# Patient Record
Sex: Male | Born: 1945
Health system: Southern US, Community
[De-identification: ages and names within clinical notes are randomized; demographics above are authoritative.]

## PROBLEM LIST (undated history)

## (undated) DIAGNOSIS — L80 Vitiligo: Secondary | ICD-10-CM

## (undated) DIAGNOSIS — M1990 Unspecified osteoarthritis, unspecified site: Secondary | ICD-10-CM

## (undated) DIAGNOSIS — K219 Gastro-esophageal reflux disease without esophagitis: Secondary | ICD-10-CM

## (undated) DIAGNOSIS — M069 Rheumatoid arthritis, unspecified: Secondary | ICD-10-CM

## (undated) DIAGNOSIS — D126 Benign neoplasm of colon, unspecified: Secondary | ICD-10-CM

## (undated) DIAGNOSIS — I10 Essential (primary) hypertension: Secondary | ICD-10-CM

## (undated) DIAGNOSIS — B1920 Unspecified viral hepatitis C without hepatic coma: Secondary | ICD-10-CM

## (undated) HISTORY — PX: OTHER SURGICAL HISTORY: SHX169

## (undated) HISTORY — DX: Essential (primary) hypertension: I10

## (undated) HISTORY — DX: Unspecified osteoarthritis, unspecified site: M19.90

## (undated) HISTORY — DX: Gastro-esophageal reflux disease without esophagitis: K21.9

## (undated) HISTORY — DX: Rheumatoid arthritis, unspecified: M06.9

## (undated) HISTORY — DX: Benign neoplasm of colon, unspecified: D12.6

## (undated) HISTORY — DX: Vitiligo: L80

## (undated) HISTORY — DX: Unspecified viral hepatitis C without hepatic coma: B19.20

## (undated) HISTORY — PX: POLYPECTOMY: SHX149

---

## 2003-01-12 ENCOUNTER — Ambulatory Visit (HOSPITAL_COMMUNITY): Admission: RE | Admit: 2003-01-12 | Discharge: 2003-01-12 | Payer: Self-pay | Admitting: Family Medicine

## 2003-01-12 ENCOUNTER — Encounter: Payer: Self-pay | Admitting: Family Medicine

## 2005-02-03 ENCOUNTER — Ambulatory Visit: Payer: Self-pay | Admitting: Gastroenterology

## 2005-02-27 ENCOUNTER — Ambulatory Visit (HOSPITAL_COMMUNITY): Admission: RE | Admit: 2005-02-27 | Discharge: 2005-02-27 | Payer: Self-pay | Admitting: Obstetrics and Gynecology

## 2005-02-27 ENCOUNTER — Encounter (INDEPENDENT_AMBULATORY_CARE_PROVIDER_SITE_OTHER): Payer: Self-pay | Admitting: Interventional Radiology

## 2005-07-03 ENCOUNTER — Ambulatory Visit: Payer: Self-pay | Admitting: Gastroenterology

## 2005-08-12 ENCOUNTER — Ambulatory Visit: Payer: Self-pay | Admitting: Cardiology

## 2006-06-18 ENCOUNTER — Ambulatory Visit: Payer: Self-pay | Admitting: Internal Medicine

## 2006-07-02 ENCOUNTER — Ambulatory Visit: Payer: Self-pay | Admitting: Internal Medicine

## 2006-07-02 ENCOUNTER — Encounter (INDEPENDENT_AMBULATORY_CARE_PROVIDER_SITE_OTHER): Payer: Self-pay | Admitting: Specialist

## 2006-09-30 ENCOUNTER — Ambulatory Visit: Payer: Self-pay | Admitting: Gastroenterology

## 2006-10-07 ENCOUNTER — Ambulatory Visit: Payer: Self-pay | Admitting: Gastroenterology

## 2007-01-12 ENCOUNTER — Ambulatory Visit: Payer: Self-pay | Admitting: Gastroenterology

## 2007-03-10 ENCOUNTER — Ambulatory Visit: Payer: Self-pay | Admitting: Gastroenterology

## 2007-03-24 ENCOUNTER — Ambulatory Visit: Payer: Self-pay | Admitting: Gastroenterology

## 2007-04-14 ENCOUNTER — Ambulatory Visit: Payer: Self-pay | Admitting: Gastroenterology

## 2007-04-28 ENCOUNTER — Ambulatory Visit: Payer: Self-pay | Admitting: Gastroenterology

## 2007-05-19 ENCOUNTER — Ambulatory Visit: Payer: Self-pay | Admitting: Gastroenterology

## 2007-06-02 ENCOUNTER — Ambulatory Visit: Payer: Self-pay | Admitting: Gastroenterology

## 2007-06-16 ENCOUNTER — Ambulatory Visit: Payer: Self-pay | Admitting: Gastroenterology

## 2010-05-31 ENCOUNTER — Encounter: Payer: Self-pay | Admitting: Gastroenterology

## 2011-03-09 ENCOUNTER — Emergency Department (HOSPITAL_COMMUNITY): Payer: Medicare Other

## 2011-03-09 ENCOUNTER — Emergency Department (HOSPITAL_COMMUNITY)
Admission: EM | Admit: 2011-03-09 | Discharge: 2011-03-09 | Disposition: A | Discharge status: Home / Self Care | Payer: Medicare Other | Attending: Emergency Medicine | Admitting: Emergency Medicine

## 2011-03-09 DIAGNOSIS — Y9367 Activity, basketball: Secondary | ICD-10-CM | POA: Insufficient documentation

## 2011-03-09 DIAGNOSIS — W1801XA Striking against sports equipment with subsequent fall, initial encounter: Secondary | ICD-10-CM | POA: Insufficient documentation

## 2011-03-09 DIAGNOSIS — M7989 Other specified soft tissue disorders: Secondary | ICD-10-CM | POA: Insufficient documentation

## 2011-03-09 DIAGNOSIS — Y929 Unspecified place or not applicable: Secondary | ICD-10-CM | POA: Insufficient documentation

## 2011-03-09 DIAGNOSIS — I10 Essential (primary) hypertension: Secondary | ICD-10-CM | POA: Insufficient documentation

## 2011-03-09 DIAGNOSIS — T148XXA Other injury of unspecified body region, initial encounter: Secondary | ICD-10-CM | POA: Insufficient documentation

## 2011-03-09 DIAGNOSIS — M25579 Pain in unspecified ankle and joints of unspecified foot: Secondary | ICD-10-CM | POA: Insufficient documentation

## 2011-07-08 ENCOUNTER — Encounter: Payer: Self-pay | Admitting: Internal Medicine

## 2011-07-28 ENCOUNTER — Encounter: Payer: Self-pay | Admitting: Internal Medicine

## 2011-09-07 ENCOUNTER — Ambulatory Visit (AMBULATORY_SURGERY_CENTER): Payer: Medicare Other | Admitting: *Deleted

## 2011-09-07 VITALS — Ht 67.75 in | Wt 170.0 lb

## 2011-09-07 DIAGNOSIS — Z1211 Encounter for screening for malignant neoplasm of colon: Secondary | ICD-10-CM

## 2011-09-07 MED ORDER — PEG-KCL-NACL-NASULF-NA ASC-C 100 G PO SOLR: ORAL | Status: DC

## 2011-09-18 ENCOUNTER — Telehealth: Payer: Self-pay | Admitting: Internal Medicine

## 2011-09-18 NOTE — Telephone Encounter (Signed)
Left message for pt to call back  °

## 2011-09-23 ENCOUNTER — Encounter: Payer: Self-pay | Admitting: Internal Medicine

## 2011-09-23 ENCOUNTER — Ambulatory Visit (AMBULATORY_SURGERY_CENTER): Payer: Medicare Other | Admitting: Internal Medicine

## 2011-09-23 VITALS — BP 152/69 | HR 62 | Temp 97.2°F | Resp 14 | Ht 67.75 in | Wt 170.0 lb

## 2011-09-23 DIAGNOSIS — Z86010 Personal history of colonic polyps: Secondary | ICD-10-CM

## 2011-09-23 DIAGNOSIS — Z1211 Encounter for screening for malignant neoplasm of colon: Secondary | ICD-10-CM

## 2011-09-23 MED ORDER — SODIUM CHLORIDE 0.9 % IV SOLN: 500.0000 mL | INTRAVENOUS | Status: DC

## 2011-09-23 NOTE — Op Note (Signed)
[facility] [address] [place], [place]  [address]  COLONOSCOPY PROCEDURE REPORT  PATIENT:  Jimmy, Castaneda  MR#:  [id number] BIRTHDATE:  Dec 08, 1945, 65 yrs. old  GENDER:  male ENDOSCOPIST:  Hedwig Morton. Juanda Chance, MD REF. BY: PROCEDURE DATE:  09/23/2011 PROCEDURE:  Colonoscopy 19147 ASA CLASS:  Class II INDICATIONS:  history of pre-cancerous (adenomatous) colon polyps tubular adenoma removed in 2008 MEDICATIONS:   MAC sedation, administered by CRNA, propofol (Diprivan) 230 mg  DESCRIPTION OF PROCEDURE:   After the risks and benefits and of the procedure were explained, informed consent was obtained. Digital rectal exam was performed and revealed no rectal masses. The LB CF-[id number] endoscope was introduced through the anus and advanced to the cecum, which was identified by both the appendix and ileocecal valve.  The quality of the prep was excellent, using MoviPrep.  The instrument was then slowly withdrawn as the colon was fully examined. <<PROCEDUREIMAGES>>  FINDINGS:  Internal Hemorrhoids were found (see image6 and image5).  Scattered diverticula were found in the sigmoid colon (see image4 and image1).  This was otherwise a normal examination of the colon (see image2 and image3).   Retroflexed views in the rectum revealed no abnormalities.    The scope was then withdrawn from the patient and the procedure completed.  COMPLICATIONS:  None ENDOSCOPIC IMPRESSION: 1) Internal hemorrhoids 2) Diverticula, scattered in the sigmoid colon 3) Otherwise normal examination RECOMMENDATIONS: 1) High fiber diet.  REPEAT EXAM:  In 10 year(s) for.  ______________________________ Hedwig Morton. Juanda Chance, MD  CC:  n. eSIGNED:   Hedwig Morton. Hien Cunliffe at 09/23/2011 11:39 AM  Julianne Rice, [phone]

## 2011-09-23 NOTE — Telephone Encounter (Signed)
Unable to determine who called patient.

## 2011-09-23 NOTE — Patient Instructions (Signed)
INTERNAL HEMORRHOIDS AND DIVERTICULOSIS SEEN. TRY TO FOLLOW HIGH FIBER DIET,SEE HANDOUTS. REPEAT COLONOSCOPY IN 10 YEARS. FOLLOW DISCHARGE INSTRUCTIONS GIVEN TODAY. THANK YOU!!   YOU HAD AN ENDOSCOPIC PROCEDURE TODAY AT THE [facility]: Refer to the procedure report that was given to you for any specific questions about what was found during the examination.  If the procedure report does not answer your questions, please call your gastroenterologist to clarify.  If you requested that your care partner not be given the details of your procedure findings, then the procedure report has been included in a sealed envelope for you to review at your convenience later.  YOU SHOULD EXPECT: Some feelings of bloating in the abdomen. Passage of more gas than usual.  Walking can help get rid of the air that was put into your GI tract during the procedure and reduce the bloating. If you had a lower endoscopy (such as a colonoscopy or flexible sigmoidoscopy) you may notice spotting of blood in your stool or on the toilet paper. If you underwent a bowel prep for your procedure, then you may not have a normal bowel movement for a few days.  DIET: Your first meal following the procedure should be a light meal and then it is ok to progress to your normal diet.  A half-sandwich or bowl of soup is an example of a good first meal.  Heavy or fried foods are harder to digest and may make you feel nauseous or bloated.  Likewise meals heavy in dairy and vegetables can cause extra gas to form and this can also increase the bloating.  Drink plenty of fluids but you should avoid alcoholic beverages for 24 hours.  ACTIVITY: Your care partner should take you home directly after the procedure.  You should plan to take it easy, moving slowly for the rest of the day.  You can resume normal activity the day after the procedure however you should NOT DRIVE or use heavy machinery for 24 hours (because of the sedation medicines  used during the test).    SYMPTOMS TO REPORT IMMEDIATELY: A gastroenterologist can be reached at any hour.  During normal business hours, 8:30 AM to 5:00 PM Monday through Friday, call [phone].  After hours and on weekends, please call the GI answering service at [phone] who will take a message and have the physician on call contact you.   Following lower endoscopy (colonoscopy or flexible sigmoidoscopy):  Excessive amounts of blood in the stool  Significant tenderness or worsening of abdominal pains  Swelling of the abdomen that is new, acute  Fever of 100F or higher  FOLLOW UP:  Our staff will call the home number listed on your records the next business day following your procedure to check on you and address any questions or concerns that you may have at that time regarding the information given to you following your procedure. This is a courtesy call and so if there is no answer at the home number and we have not heard from you through the emergency physician on call, we will assume that you have returned to your regular daily activities without incident.  SIGNATURES/CONFIDENTIALITY: You and/or your care partner have signed paperwork which will be entered into your electronic medical record.  These signatures attest to the fact that that the information above on your After Visit Summary has been reviewed and is understood.  Full responsibility of the confidentiality of this discharge information lies with you and/or your care-partner.

## 2011-09-23 NOTE — Progress Notes (Signed)
Patient did not experience any of the following events: a burn prior to discharge; a fall within the facility; wrong site/side/patient/procedure/implant event; or a hospital transfer or hospital admission upon discharge from the facility. (G8907) Patient did not have preoperative order for IV antibiotic SSI prophylaxis. (G8918)  

## 2011-09-24 ENCOUNTER — Telehealth: Payer: Self-pay | Admitting: *Deleted

## 2012-05-11 ENCOUNTER — Encounter (HOSPITAL_COMMUNITY): Payer: Self-pay | Admitting: Emergency Medicine

## 2012-05-11 ENCOUNTER — Emergency Department (INDEPENDENT_AMBULATORY_CARE_PROVIDER_SITE_OTHER)
Admission: EM | Admit: 2012-05-11 | Discharge: 2012-05-11 | Disposition: A | Discharge status: Home / Self Care | Payer: Medicare Other | Source: Home / Self Care

## 2012-05-11 DIAGNOSIS — I10 Essential (primary) hypertension: Secondary | ICD-10-CM

## 2012-05-11 HISTORY — PX: COLONOSCOPY: SHX174

## 2012-05-11 MED ORDER — AMLODIPINE BESYLATE 5 MG PO TABS: ORAL_TABLET | ORAL | Status: DC

## 2012-05-11 NOTE — ED Provider Notes (Signed)
History     CSN: [id number]  Arrival date & time 05/11/12  1028   None     Chief Complaint  Patient presents with  . Medication Refill    (Consider location/radiation/quality/duration/timing/severity/associated sxs/prior treatment) HPI Comments: 67 year old male with a history of hypertension and is requesting a refill Norvasc 5 mg. In addition he is treated with metoprolol 100 mg tablets one half tablet twice a day and HCTZ 25 mg daily. He is recently established with a PCP but his initial appointment is in March. He denies chest pain, shortness of breath, edema or exertional symptoms. He has no complaints today.   Past Medical History  Diagnosis Date  . Hypertension   . Adenomatous colon polyp     Past Surgical History  Procedure Date  . Polypectomy   . Colonoscopy     No family history on file.  History  Substance Use Topics  . Smoking status: Former Games developer  . Smokeless tobacco: Never Used  . Alcohol Use: 3.6 oz/week    6 Cans of beer per week      Review of Systems  All other systems reviewed and are negative.    Allergies  Aspirin  Home Medications   Current Outpatient Rx  Name  Route  Sig  Dispense  Refill  . AMLODIPINE BESYLATE 5 MG PO TABS   Oral   Take 5 mg by mouth daily.         Marland Kitchen HYDROCHLOROTHIAZIDE 25 MG PO TABS   Oral   Take 25 mg by mouth daily.         Marland Kitchen METOPROLOL TARTRATE 100 MG PO TABS   Oral   Take 100 mg by mouth 2 (two) times daily. 1/2 tablet twice a day         . AMLODIPINE BESYLATE 5 MG PO TABS      1 tablet po q d for BP   30 tablet   0   . NAPROXEN 500 MG PO TABS   Oral   Take 500 mg by mouth 2 (two) times daily with a meal.         . TACROLIMUS 0.1 % EX OINT   Topical   Apply 1 application topically 2 (two) times daily.         . TRIAMCINOLONE ACETONIDE 0.1 % EX CREA   Topical   Apply 1 application topically 2 (two) times daily.           BP 135/76  Pulse 52  Temp 98.1 F (36.7 C) (Oral)   Resp 20  SpO2 100%  Physical Exam  Constitutional: He is oriented to person, place, and time. He appears well-developed and well-nourished. No distress.  HENT:  Head: Normocephalic and atraumatic.  Eyes: Conjunctivae normal and EOM are normal.  Neck: Normal range of motion. Neck supple.  Cardiovascular: Normal rate, regular rhythm and normal heart sounds.   Pulmonary/Chest: Effort normal and breath sounds normal. No respiratory distress. He has no wheezes.  Musculoskeletal: Normal range of motion. He exhibits no edema and no tenderness.  Neurological: He is alert and oriented to person, place, and time. He exhibits normal muscle tone.  Skin: Skin is warm and dry. No rash noted.  Psychiatric: He has a normal mood and affect.    ED Course  Procedures (including critical care time)  Labs Reviewed - No data to display No results found.   1. HTN (hypertension)       MDM  Amlodipine 5 mg  one daily for blood pressure. #30.  He is to f/u his physician as soon as possible. He should also take his blood pressures out of the office such as a [employer] or other drugstores and write them down to bring to his physician. Mariea Clonts new symptoms problems or worsening to may return Is discharged in stable condition.        Hayden Rasmussen, NP 05/11/12 1114

## 2012-05-11 NOTE — ED Notes (Signed)
Pt is here to have is Norvasc refilled... Has an appt w/PCP to establish in March and will run out of his med by then... Denies: any med problems... He is alert w/no signs of acute distress.

## 2012-05-12 NOTE — ED Provider Notes (Signed)
Medical screening examination/treatment/procedure(s) were performed by resident physician or non-physician practitioner and as supervising physician I was immediately available for consultation/collaboration.   Mysti Haley DOUGLAS MD.    Jacquese Cassarino D Balian Schaller, MD 05/12/12 2037 

## 2013-06-02 ENCOUNTER — Other Ambulatory Visit (HOSPITAL_COMMUNITY): Payer: Self-pay | Admitting: Dentistry

## 2013-06-02 ENCOUNTER — Encounter (INDEPENDENT_AMBULATORY_CARE_PROVIDER_SITE_OTHER): Payer: Self-pay

## 2013-06-02 ENCOUNTER — Encounter (HOSPITAL_COMMUNITY): Payer: Self-pay | Admitting: Dentistry

## 2013-06-02 ENCOUNTER — Ambulatory Visit (HOSPITAL_COMMUNITY): Payer: Self-pay | Admitting: Dentistry

## 2013-06-02 VITALS — BP 135/80 | HR 52 | Temp 97.8°F

## 2013-06-02 DIAGNOSIS — M1610 Unilateral primary osteoarthritis, unspecified hip: Secondary | ICD-10-CM

## 2013-06-02 DIAGNOSIS — M169 Osteoarthritis of hip, unspecified: Secondary | ICD-10-CM | POA: Insufficient documentation

## 2013-06-02 DIAGNOSIS — K045 Chronic apical periodontitis: Secondary | ICD-10-CM | POA: Insufficient documentation

## 2013-06-02 DIAGNOSIS — K0889 Other specified disorders of teeth and supporting structures: Secondary | ICD-10-CM

## 2013-06-02 DIAGNOSIS — K083 Retained dental root: Secondary | ICD-10-CM | POA: Insufficient documentation

## 2013-06-02 DIAGNOSIS — K0530 Chronic periodontitis, unspecified: Secondary | ICD-10-CM | POA: Insufficient documentation

## 2013-06-02 DIAGNOSIS — IMO0002 Reserved for concepts with insufficient information to code with codable children: Secondary | ICD-10-CM

## 2013-06-02 DIAGNOSIS — Z972 Presence of dental prosthetic device (complete) (partial): Secondary | ICD-10-CM

## 2013-06-02 DIAGNOSIS — K08409 Partial loss of teeth, unspecified cause, unspecified class: Secondary | ICD-10-CM

## 2013-06-02 DIAGNOSIS — M264 Malocclusion, unspecified: Secondary | ICD-10-CM

## 2013-06-02 DIAGNOSIS — K029 Dental caries, unspecified: Secondary | ICD-10-CM

## 2013-06-02 DIAGNOSIS — Z0189 Encounter for other specified special examinations: Secondary | ICD-10-CM

## 2013-06-02 DIAGNOSIS — K036 Deposits [accretions] on teeth: Secondary | ICD-10-CM

## 2013-06-02 NOTE — Progress Notes (Signed)
DENTAL CONSULTATION  Date of Consultation:  06/02/2013 Patient Name:   Jimmy Castaneda Date of Birth:   April 14, 1946 Medical Record Number: [id number]  VITALS: BP 135/80  Pulse 52  Temp(Src) 97.8 F (36.6 C) (Oral)   CHIEF COMPLAINT: Patient is referred by Dr. Adriana Mccallum for a pre-total hip replacement dental protocol examination.  HPI: Jimmy Castaneda is a 68 year old male with osteoarthritis of the left hip. Patient with anticipated total hip replacement with Dr. Alvan Dame. Patient was referred referred by Dr. Adriana Mccallum for a medically necessary pre-total hip replacement dental protocol examination to rule out dental infection that may affect the patient's systemic health and anticipated total hip replacement.  Patient currently denies acute toothache, swellings, or abscesses. Patient was last seen approximately 10 years ago to have an upper flexible partial denture fabricated. Patient is not having any problems with this upper partial denture. Patient does not seek regular dental care. Patient is interested in having all remaining teeth extracted at this time with subsequent fabrication of upper and lower complete dentures after adequate healing.  PROBLEM LIST: Patient Active Problem List   Diagnosis Date Noted  . Osteoarthritis of hip 06/02/2013  . 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 Retained dental roots 06/02/2013  . Chronic periodontitis 06/02/2013    PMH: Past Medical History  Diagnosis Date  . Hypertension   . Adenomatous colon polyp   . GERD (gastroesophageal reflux disease)   . Hepatitis C   . Osteoarthritis   . Rheumatoid arthritis   . Vitiligo     PSH: Past Surgical History  Procedure Laterality Date  . Polypectomy    . Colonoscopy      ALLERGIES: Allergies  Allergen Reactions  . Aspirin Hives    MEDICATIONS: Current Outpatient Prescriptions  Medication Sig Dispense Refill  . amLODipine (NORVASC) 5 MG tablet 1 tablet po q d for BP  30 tablet   0  . hydrochlorothiazide (HYDRODIURIL) 25 MG tablet Take 25 mg by mouth daily.      . metoprolol (LOPRESSOR) 100 MG tablet Take 100 mg by mouth 2 (two) times daily. 1/2 tablet twice a day      . oxyCODONE-acetaminophen (PERCOCET/ROXICET) 5-325 MG per tablet Take 1-2 tablets by mouth daily.      . tacrolimus (PROTOPIC) 0.1 % ointment Apply 1 application topically 2 (two) times daily.      . naproxen (NAPROSYN) 500 MG tablet Take 500 mg by mouth 2 (two) times daily with a meal.      . triamcinolone cream (KENALOG) 0.1 % Apply 1 application topically 2 (two) times daily.       No current facility-administered medications for this visit.    LABS: No results found for this basename: WBC, HGB, HCT, MCV, PLT   No results found for this basename: na, k, cl, co2, glucose, bun, creatinine, calcium, gfrnonaa, gfraa   No results found for this basename: INR, PROTIME   No results found for this basename: PTT    SOCIAL HISTORY: History   Social History  . Marital Status: Married    Spouse Name: N/A    Number of Children: 4  . Years of Education: N/A   Occupational History  . Not on file.   Social History Main Topics  . Smoking status: Former Smoker -- 0.50 packs/day for 35 years    Quit date: 06/03/1995  . Smokeless tobacco: Never Used  . Alcohol Use: 3.6 oz/week    6 Cans of beer per week  .  Drug Use: 1.00 per week     Comment: marijuana  . Sexual Activity: Not on file   Other Topics Concern  . Not on file   Social History Narrative  . No narrative on file    FAMILY HISTORY: Family History  Problem Relation Age of Onset  . Diabetes Mother   . Diabetes Father   . Hypertension Mother   . Hypertension Father   . Diabetes Sister      REVIEW OF SYSTEMS: Reviewed from chart for this admission.  DENTAL HISTORY: CHIEF COMPLAINT: Patient is referred by Dr. Adriana Mccallum for a pre-total hip replacement dental protocol examination.  HPI: Jimmy Castaneda is a 68 year old  male with osteoarthritis of the left hip. Patient with anticipated total hip replacement with Dr. Alvan Dame. Patient was referred referred by Dr. Adriana Mccallum for a medically necessary pre-total hip replacement dental protocol examination to rule out dental infection that may affect the patient's systemic health and anticipated total hip replacement.  Patient currently denies acute toothache, swellings, or abscesses. Patient was last seen approximately 10 years ago to have an upper flexible partial denture fabricated. Patient is not having any problems with this upper partial denture. Patient does not seek regular dental care. Patient is interested in having all remaining teeth extracted at this time with subsequent fabrication of upper and lower complete dentures after adequate healing.   DENTAL EXAMINATION:  GENERAL: The patient is a well-developed, well-nourished male in no acute distress. HEAD AND NECK: There is no palpable submandibular lymphadenopathy. The patient denies acute TMJ symptoms  INTRAORAL EXAM: The patient has normal saliva. I do not see any evidence of abscess formation in the mouth. The patient has atrophy of the edentulous alveolar ridges.  DENTITION: The patient is missing tooth numbers 1, 2, 6, 7, 11, 13, 14, 15, 16, 17, 18, 19, 20, 24, 25, 26, 28, 29, 30, 31, and 32. Tooth numbers 10 and 12 are present as retained root segments.  PERIODONTAL: The patient has chronic periodontitis with plaque and calculus accumulations, generalized gingival recession, and generalized tooth mobility. There is moderate to severe bone loss noted.  DENTAL CARIES/SUBOPTIMAL RESTORATIONS: There are multiple dental caries noted as per dental charting form. ENDODONTIC: Patient currently denies acute pulpitis symptoms. The patient has multiple areas of periapical pathology noted at the apices of tooth numbers 3, 8, 10, 12. CROWN AND BRIDGE: There are no crown or bridge restorations noted.  PROSTHODONTIC: The  patient has an upper acrylic flexible partial denture. This partial has less than ideal retention and stability  OCCLUSION: The patient has a poor occlusal scheme secondary to multiple missing teeth, multiple retained root segments, supra-eruption and drifting of the unopposed teeth into the edentulous areas, and lack of replacement of the missing teeth with clinically acceptable dental prostheses.   RADIOGRAPHIC INTERPRETATION:  An orthopantogram was taken and supplemented with 8 periapical radiographs. There are multiple missing teeth. There are multiple retained root segments. There are multiple dental caries noted. There multiple areas of periapical pathology and radiolucency. There is supra-eruption and drifting of the unopposed teeth into the edentulous areas. There is moderate to severe bone loss noted.    ASSESSMENTS: 1.  Osteoarthritis of the left hip with anticipated total hip replacement 2. Chronic apical periodontitis 3. Chronic periodontitis with bone loss 4. Gingival recession 5. Tooth mobility 6. Plaque and calculus accumulations 7. Multiple dental caries 8. Multiple missing teeth 9. Multiple retained root segments 10. Supra-eruption and drifting of the unopposed teeth  into the edentulous areas 11. Atrophy of the edentulous alveolar ridge 12. Ill fitting maxillary acrylic partial denture 13. Poor occlusal scheme 14. History of hepatitis C with questionable risk for bleeding with invasive dental procedures  PLAN/RECOMMENDATIONS: 1. I discussed the risks, benefits, and complications of various treatment options with the patient in relationship to his medical and dental conditions, anticipated total hip replacement, and the potential risk for infection from teeth without dental treatment. We discussed various treatment options to include no treatment, multiple extractions with alveoloplasty, pre-prosthetic surgery as indicated, periodontal therapy, dental restorations, root canal  therapy, crown and bridge therapy, implant therapy, and replacement of missing teeth as indicated. The patient currently wishes to proceed with  extraction of remaining teeth with alveoloplasty and pre-prosthetic surgery as indicated in the operating room with general anesthesia.  This is been scheduled for 06/22/2013 at 7:30 AM at [facility]. Presurgical testing appointment has been scheduled for 06/19/2013 at 8 AM at [facility].  Patient will then followup with a dentist of his choice for fabrication of upper lower complete dentures after adequate healing. Patient will followup with Dr. Adriana Mccallum for total hip replacement after adequate healing from the anticipated dental extractions.  The patient has expressed interest in having his teeth extracted and dentures fabricated before proceeding with his total hip replacement.   2. Discussion of findings with medical team and coordination of future medical and dental care as needed.  I spent 60 minutes face to face with patient and more than 50% of time was spent in counseling and /or coordination of care.   Lenn Cal, DDS

## 2013-06-02 NOTE — Patient Instructions (Signed)
Patient to be scheduled for multiple extractions with alveoloplasty and pre-prosthetic surgery as indicated in the operating room with general anesthesia. Patient then to proceed with total hip replacement after adequate healing.. Patient is to follow up with the general dentist of his choice for fabrication of upper and lower complete dentures after adequate healing and once medically stable from the anticipated hip replacement.  Dr. Enrique Sack

## 2013-06-19 ENCOUNTER — Encounter (HOSPITAL_COMMUNITY): Payer: Self-pay | Admitting: Pharmacy Technician

## 2013-06-19 ENCOUNTER — Encounter (HOSPITAL_COMMUNITY): Payer: Self-pay

## 2013-06-19 ENCOUNTER — Ambulatory Visit (HOSPITAL_COMMUNITY)
Admission: RE | Admit: 2013-06-19 | Discharge: 2013-06-19 | Disposition: A | Discharge status: Home / Self Care | Payer: Medicare Other | Source: Ambulatory Visit | Attending: Dentistry | Admitting: Dentistry

## 2013-06-19 ENCOUNTER — Encounter (HOSPITAL_COMMUNITY)
Admission: RE | Admit: 2013-06-19 | Discharge: 2013-06-19 | Disposition: A | Discharge status: Home / Self Care | Payer: Medicare Other | Source: Ambulatory Visit | Attending: Dentistry | Admitting: Dentistry

## 2013-06-19 DIAGNOSIS — Z01818 Encounter for other preprocedural examination: Secondary | ICD-10-CM | POA: Insufficient documentation

## 2013-06-19 DIAGNOSIS — I498 Other specified cardiac arrhythmias: Secondary | ICD-10-CM | POA: Insufficient documentation

## 2013-06-19 DIAGNOSIS — Z01810 Encounter for preprocedural cardiovascular examination: Secondary | ICD-10-CM | POA: Insufficient documentation

## 2013-06-19 DIAGNOSIS — Z01812 Encounter for preprocedural laboratory examination: Secondary | ICD-10-CM | POA: Insufficient documentation

## 2013-06-19 NOTE — Patient Instructions (Signed)
   YOUR SURGERY IS SCHEDULED AT [facility]  ON:   Thursday  2/12  REPORT TO  SHORT STAY CENTER AT:  5:30 AM      PHONE # FOR SHORT STAY IS [phone]  DO NOT EAT OR DRINK ANYTHING AFTER MIDNIGHT THE NIGHT BEFORE YOUR SURGERY.  YOU MAY BRUSH YOUR TEETH, RINSE OUT YOUR MOUTH--BUT NO WATER, NO FOOD, NO CHEWING GUM, NO MINTS, NO CANDIES, NO CHEWING TOBACCO.  PLEASE TAKE THE FOLLOWING MEDICATIONS THE AM OF YOUR SURGERY WITH A FEW SIPS OF WATER:  AMLODIPINE, METOPROLOL   DO NOT BRING VALUABLES, MONEY, CREDIT CARDS.  DO NOT WEAR JEWELRY, MAKE-UP, NAIL POLISH AND NO METAL PINS OR CLIPS IN YOUR HAIR. CONTACT LENS, DENTURES / PARTIALS, GLASSES SHOULD NOT BE WORN TO SURGERY AND IN MOST CASES-HEARING AIDS WILL NEED TO BE REMOVED.  BRING YOUR GLASSES CASE, ANY EQUIPMENT NEEDED FOR YOUR CONTACT LENS. FOR PATIENTS ADMITTED TO THE HOSPITAL--CHECK OUT TIME THE DAY OF DISCHARGE IS 11:00 AM.  ALL INPATIENT ROOMS ARE PRIVATE - WITH BATHROOM, TELEPHONE, TELEVISION AND WIFI INTERNET.  IF YOU ARE BEING DISCHARGED THE SAME DAY OF YOUR SURGERY--YOU CAN NOT DRIVE YOURSELF HOME--AND SHOULD NOT GO HOME ALONE BY TAXI OR BUS.  NO DRIVING OR OPERATING MACHINERY, DO NOT MAKE LEGAL DECISIONS FOR 24 HOURS FOLLOWING ANESTHESIA / PAIN MEDICATIONS.  PLEASE MAKE ARRANGEMENTS FOR SOMEONE TO BE WITH YOU AT HOME THE FIRST 24 HOURS AFTER SURGERY. RESPONSIBLE DRIVER'S NAME / PHONE   FRIEND - ERNIE GREEN  [address] INSTRUCTIONS MAY RESULT IN THE CANCELLATION OF YOUR SURGERY. PLEASE BE AWARE THAT YOU MAY NEED ADDITIONAL BLOOD DRAWN DAY OF YOUR SURGERY  PATIENT SIGNATURE_________________________________

## 2013-06-19 NOTE — Pre-Procedure Instructions (Signed)
MEDICAL CLEARANCE, CBC, CMET, PT, PTT RESULTS ON PT'S CHART FROM DR. HUSAIN-DONE 06/16/13.  EKG AND CXR WERE DONE TODAY - PREOP - AT [facility]

## 2013-06-21 NOTE — Anesthesia Preprocedure Evaluation (Addendum)
Anesthesia Evaluation  Patient identified by MRN, date of birth, ID band Patient awake    Reviewed: Allergy & Precautions, H&P , NPO status , Patient's Chart, lab work & pertinent test results  Airway Mallampati: II TM Distance: >3 FB Neck ROM: Full    Dental  (+) Poor Dentition, Chipped, Missing, Dental Advisory Given   Pulmonary neg pulmonary ROS, former smoker,  breath sounds clear to auscultation  Pulmonary exam normal       Cardiovascular hypertension, Pt. on medications and Pt. on home beta blockers negative cardio ROS  Rhythm:Regular Rate:Normal     Neuro/Psych negative neurological ROS  negative psych ROS   GI/Hepatic negative GI ROS, Neg liver ROS, GERD-  ,(+) Hepatitis -, C  Endo/Other  negative endocrine ROS  Renal/GU negative Renal ROS  negative genitourinary   Musculoskeletal negative musculoskeletal ROS (+) Arthritis -,   Abdominal   Peds  Hematology negative hematology ROS (+)   Anesthesia Other Findings   Reproductive/Obstetrics                          Anesthesia Physical Anesthesia Plan  ASA: III  Anesthesia Plan: General   Post-op Pain Management:    Induction: Intravenous  Airway Management Planned: Nasal ETT  Additional Equipment:   Intra-op Plan:   Post-operative Plan: Extubation in OR  Informed Consent: I have reviewed the patients History and Physical, chart, labs and discussed the procedure including the risks, benefits and alternatives for the proposed anesthesia with the patient or authorized representative who has indicated his/her understanding and acceptance.   Dental advisory given  Plan Discussed with: CRNA  Anesthesia Plan Comments:         Anesthesia Quick Evaluation

## 2013-06-22 ENCOUNTER — Encounter (HOSPITAL_COMMUNITY): Payer: Self-pay | Admitting: *Deleted

## 2013-06-22 ENCOUNTER — Encounter (HOSPITAL_COMMUNITY): Payer: Medicare Other | Admitting: Anesthesiology

## 2013-06-22 ENCOUNTER — Ambulatory Visit (HOSPITAL_COMMUNITY): Payer: Medicare Other | Admitting: Anesthesiology

## 2013-06-22 ENCOUNTER — Encounter (HOSPITAL_COMMUNITY)
Admission: RE | Disposition: A | Discharge status: Home / Self Care | Payer: Self-pay | Source: Ambulatory Visit | Attending: Dentistry

## 2013-06-22 ENCOUNTER — Ambulatory Visit (HOSPITAL_COMMUNITY)
Admission: RE | Admit: 2013-06-22 | Discharge: 2013-06-22 | Disposition: A | Discharge status: Home / Self Care | Payer: Medicare Other | Source: Ambulatory Visit | Attending: Dentistry | Admitting: Dentistry

## 2013-06-22 DIAGNOSIS — K083 Retained dental root: Secondary | ICD-10-CM | POA: Insufficient documentation

## 2013-06-22 DIAGNOSIS — Z87891 Personal history of nicotine dependence: Secondary | ICD-10-CM | POA: Insufficient documentation

## 2013-06-22 DIAGNOSIS — K029 Dental caries, unspecified: Secondary | ICD-10-CM | POA: Insufficient documentation

## 2013-06-22 DIAGNOSIS — K045 Chronic apical periodontitis: Secondary | ICD-10-CM

## 2013-06-22 DIAGNOSIS — Z79899 Other long term (current) drug therapy: Secondary | ICD-10-CM | POA: Insufficient documentation

## 2013-06-22 DIAGNOSIS — M1610 Unilateral primary osteoarthritis, unspecified hip: Secondary | ICD-10-CM | POA: Insufficient documentation

## 2013-06-22 DIAGNOSIS — K0530 Chronic periodontitis, unspecified: Secondary | ICD-10-CM

## 2013-06-22 DIAGNOSIS — M169 Osteoarthritis of hip, unspecified: Secondary | ICD-10-CM

## 2013-06-22 DIAGNOSIS — K219 Gastro-esophageal reflux disease without esophagitis: Secondary | ICD-10-CM | POA: Insufficient documentation

## 2013-06-22 DIAGNOSIS — M278 Other specified diseases of jaws: Secondary | ICD-10-CM | POA: Insufficient documentation

## 2013-06-22 DIAGNOSIS — I10 Essential (primary) hypertension: Secondary | ICD-10-CM | POA: Insufficient documentation

## 2013-06-22 DIAGNOSIS — L80 Vitiligo: Secondary | ICD-10-CM | POA: Insufficient documentation

## 2013-06-22 HISTORY — PX: MULTIPLE EXTRACTIONS WITH ALVEOLOPLASTY: SHX5342

## 2013-06-22 SURGERY — MULTIPLE EXTRACTION WITH ALVEOLOPLASTY
Anesthesia: General

## 2013-06-22 MED ORDER — DEXAMETHASONE SODIUM PHOSPHATE 10 MG/ML IJ SOLN
INTRAMUSCULAR | Status: DC | PRN
  Administered 2013-06-22: 10 mg via INTRAVENOUS

## 2013-06-22 MED ORDER — BUPIVACAINE-EPINEPHRINE PF 0.5-1:200000 % IJ SOLN
INTRAMUSCULAR | Status: DC | PRN
  Administered 2013-06-22: 3.6 mL

## 2013-06-22 MED ORDER — OXYCODONE-ACETAMINOPHEN 10-325 MG PO TABS: 1.0000 | ORAL_TABLET | ORAL | Status: DC | PRN

## 2013-06-22 MED ORDER — ROCURONIUM BROMIDE 100 MG/10ML IV SOLN
INTRAVENOUS | Status: DC | PRN
  Administered 2013-06-22: 50 mg via INTRAVENOUS
  Administered 2013-06-22: 10 mg via INTRAVENOUS

## 2013-06-22 MED ORDER — PROMETHAZINE HCL 25 MG/ML IJ SOLN: 6.2500 mg | INTRAMUSCULAR | Status: DC | PRN

## 2013-06-22 MED ORDER — OXYCODONE-ACETAMINOPHEN 5-325 MG PO TABS
1.0000 | ORAL_TABLET | ORAL | Status: DC | PRN
  Administered 2013-06-22: 1 via ORAL
  Filled 2013-06-22: qty 1

## 2013-06-22 MED ORDER — NEOSTIGMINE METHYLSULFATE 1 MG/ML IJ SOLN
INTRAMUSCULAR | Status: AC
  Filled 2013-06-22: qty 10

## 2013-06-22 MED ORDER — LACTATED RINGERS IV SOLN: INTRAVENOUS | Status: DC

## 2013-06-22 MED ORDER — HYDROMORPHONE HCL PF 1 MG/ML IJ SOLN: 0.2500 mg | INTRAMUSCULAR | Status: DC | PRN

## 2013-06-22 MED ORDER — FENTANYL CITRATE 0.05 MG/ML IJ SOLN
INTRAMUSCULAR | Status: DC | PRN
  Administered 2013-06-22 (×3): 50 ug via INTRAVENOUS
  Administered 2013-06-22: 100 ug via INTRAVENOUS

## 2013-06-22 MED ORDER — EPHEDRINE SULFATE 50 MG/ML IJ SOLN
INTRAMUSCULAR | Status: DC | PRN
  Administered 2013-06-22: 10 mg via INTRAVENOUS
  Administered 2013-06-22 (×3): 5 mg via INTRAVENOUS
  Administered 2013-06-22: 10 mg via INTRAVENOUS

## 2013-06-22 MED ORDER — METOCLOPRAMIDE HCL 5 MG/ML IJ SOLN
INTRAMUSCULAR | Status: DC | PRN
  Administered 2013-06-22: 10 mg via INTRAVENOUS

## 2013-06-22 MED ORDER — MIDAZOLAM HCL 5 MG/5ML IJ SOLN
INTRAMUSCULAR | Status: DC | PRN
  Administered 2013-06-22: 2 mg via INTRAVENOUS

## 2013-06-22 MED ORDER — NEOSTIGMINE METHYLSULFATE 1 MG/ML IJ SOLN
INTRAMUSCULAR | Status: DC | PRN
  Administered 2013-06-22: 4 mg via INTRAVENOUS

## 2013-06-22 MED ORDER — 0.9 % SODIUM CHLORIDE (POUR BTL) OPTIME
TOPICAL | Status: DC | PRN
  Administered 2013-06-22: 1000 mL

## 2013-06-22 MED ORDER — CEFAZOLIN SODIUM-DEXTROSE 2-3 GM-% IV SOLR
2.0000 g | Freq: Once | INTRAVENOUS | Status: AC
  Administered 2013-06-22: 2 g via INTRAVENOUS

## 2013-06-22 MED ORDER — ISOPROPYL ALCOHOL 70 % SOLN
Status: AC
  Filled 2013-06-22: qty 480

## 2013-06-22 MED ORDER — GLYCOPYRROLATE 0.2 MG/ML IJ SOLN
INTRAMUSCULAR | Status: DC | PRN
  Administered 2013-06-22: 0.6 mg via INTRAVENOUS
  Administered 2013-06-22: 0.2 mg via INTRAVENOUS

## 2013-06-22 MED ORDER — PHENYLEPHRINE HCL 10 MG/ML IJ SOLN
INTRAMUSCULAR | Status: DC | PRN
  Administered 2013-06-22: 40 ug via INTRAVENOUS
  Administered 2013-06-22: 80 ug via INTRAVENOUS
  Administered 2013-06-22: 40 ug via INTRAVENOUS
  Administered 2013-06-22 (×2): 80 ug via INTRAVENOUS
  Administered 2013-06-22: 40 ug via INTRAVENOUS

## 2013-06-22 MED ORDER — LIDOCAINE-EPINEPHRINE 2 %-1:100000 IJ SOLN
INTRAMUSCULAR | Status: AC
  Filled 2013-06-22: qty 10.2

## 2013-06-22 MED ORDER — BUPIVACAINE-EPINEPHRINE PF 0.5-1:200000 % IJ SOLN
INTRAMUSCULAR | Status: AC
  Filled 2013-06-22: qty 9

## 2013-06-22 MED ORDER — PROPOFOL 10 MG/ML IV BOLUS
INTRAVENOUS | Status: DC | PRN
  Administered 2013-06-22: 170 mg via INTRAVENOUS

## 2013-06-22 MED ORDER — MIDAZOLAM HCL 2 MG/2ML IJ SOLN
INTRAMUSCULAR | Status: AC
  Filled 2013-06-22: qty 2

## 2013-06-22 MED ORDER — PROPOFOL 10 MG/ML IV BOLUS
INTRAVENOUS | Status: AC
Start: 2013-06-22 — End: 2013-06-22
  Filled 2013-06-22: qty 20

## 2013-06-22 MED ORDER — LIDOCAINE-EPINEPHRINE 2 %-1:100000 IJ SOLN
INTRAMUSCULAR | Status: DC | PRN
  Administered 2013-06-22: 8.5 mL via INTRADERMAL

## 2013-06-22 MED ORDER — LACTATED RINGERS IV SOLN
INTRAVENOUS | Status: DC | PRN
  Administered 2013-06-22 (×3): via INTRAVENOUS

## 2013-06-22 MED ORDER — ROCURONIUM BROMIDE 100 MG/10ML IV SOLN
INTRAVENOUS | Status: AC
  Filled 2013-06-22: qty 1

## 2013-06-22 MED ORDER — ONDANSETRON HCL 4 MG/2ML IJ SOLN
INTRAMUSCULAR | Status: AC
Start: 2013-06-22 — End: 2013-06-22
  Filled 2013-06-22: qty 2

## 2013-06-22 MED ORDER — FENTANYL CITRATE 0.05 MG/ML IJ SOLN
INTRAMUSCULAR | Status: AC
  Filled 2013-06-22: qty 5

## 2013-06-22 MED ORDER — PROPOFOL 10 MG/ML IV BOLUS
INTRAVENOUS | Status: AC
  Filled 2013-06-22: qty 20

## 2013-06-22 MED ORDER — LIDOCAINE HCL (CARDIAC) 20 MG/ML IV SOLN
INTRAVENOUS | Status: DC | PRN
  Administered 2013-06-22: 60 mg via INTRAVENOUS

## 2013-06-22 MED ORDER — OXYMETAZOLINE HCL 0.05 % NA SOLN
NASAL | Status: AC
Start: 2013-06-22 — End: 2013-06-22
  Filled 2013-06-22: qty 15

## 2013-06-22 MED ORDER — ONDANSETRON HCL 4 MG/2ML IJ SOLN
INTRAMUSCULAR | Status: DC | PRN
  Administered 2013-06-22: 4 mg via INTRAVENOUS

## 2013-06-22 MED ORDER — OXYCODONE HCL 5 MG PO TABS
5.0000 mg | ORAL_TABLET | ORAL | Status: DC | PRN
  Administered 2013-06-22: 5 mg via ORAL
  Filled 2013-06-22: qty 1

## 2013-06-22 MED ORDER — CEFAZOLIN SODIUM-DEXTROSE 2-3 GM-% IV SOLR
INTRAVENOUS | Status: AC
Start: 2013-06-22 — End: 2013-06-22
  Filled 2013-06-22: qty 50

## 2013-06-22 MED ORDER — ESMOLOL HCL 10 MG/ML IV SOLN
INTRAVENOUS | Status: DC | PRN
  Administered 2013-06-22: 20 mg via INTRAVENOUS

## 2013-06-22 MED ORDER — LIDOCAINE HCL (CARDIAC) 20 MG/ML IV SOLN
INTRAVENOUS | Status: AC
  Filled 2013-06-22: qty 5

## 2013-06-22 MED ORDER — GLYCOPYRROLATE 0.2 MG/ML IJ SOLN
INTRAMUSCULAR | Status: AC
  Filled 2013-06-22: qty 3

## 2013-06-22 SURGICAL SUPPLY — 26 items
ATTRACTOMAT 16X20 MAGNETIC DRP (DRAPES) ×3 IMPLANT
BAG SPEC THK2 15X12 ZIP CLS (MISCELLANEOUS) ×1
BAG ZIPLOCK 12X15 (MISCELLANEOUS) ×3 IMPLANT
BANDAGE EYE OVAL (MISCELLANEOUS) IMPLANT
BLADE SURG 15 STRL LF DISP TIS (BLADE) ×2 IMPLANT
BLADE SURG 15 STRL SS (BLADE) ×6
CANNULA VESSEL W/WING WO/VALVE (CANNULA) ×6 IMPLANT
GAUZE SPONGE 4X4 16PLY XRAY LF (GAUZE/BANDAGES/DRESSINGS) ×3 IMPLANT
GLOVE SURG ORTHO 8.0 STRL STRW (GLOVE) ×3 IMPLANT
GLOVE SURG SS PI 6.5 STRL IVOR (GLOVE) ×3 IMPLANT
GOWN STRL REUS W/TWL 2XL LVL3 (GOWN DISPOSABLE) ×3 IMPLANT
GOWN STRL REUS W/TWL LRG LVL3 (GOWN DISPOSABLE) ×3 IMPLANT
KIT BASIN OR (CUSTOM PROCEDURE TRAY) ×3 IMPLANT
NS IRRIG 1000ML POUR BTL (IV SOLUTION) ×3 IMPLANT
PACK EENT SPLIT (PACKS) ×3 IMPLANT
PACKING VAGINAL (PACKING) ×3 IMPLANT
SPONGE GAUZE 4X4 12PLY (GAUZE/BANDAGES/DRESSINGS) ×1 IMPLANT
SUCTION FRAZIER 12FR DISP (SUCTIONS) ×3 IMPLANT
SUT CHROMIC 3 0 PS 2 (SUTURE) ×12 IMPLANT
SUT CHROMIC 4 0 P 3 18 (SUTURE) IMPLANT
SYR 50ML LL SCALE MARK (SYRINGE) ×3 IMPLANT
TOWEL OR 17X26 10 PK STRL BLUE (TOWEL DISPOSABLE) ×3 IMPLANT
TUBING CONNECTING 10 (TUBING) ×2 IMPLANT
TUBING CONNECTING 10' (TUBING) ×1
WATER STERILE IRR 1500ML POUR (IV SOLUTION) ×3 IMPLANT
YANKAUER SUCT BULB TIP NO VENT (SUCTIONS) ×3 IMPLANT

## 2013-06-22 NOTE — H&P (Signed)
06/22/2013  Patient:            Jimmy Castaneda Date of Birth:  1945-12-21 MRN:                [id number]  Patient denies acute medical or dental changes. Please see H&P report by Dr. Lysle Rubens dated 06/16/2013 that was provided to presurgical testing to scan into medical record.  Lenn Cal, DDS

## 2013-06-22 NOTE — Progress Notes (Signed)
PRE-OPERATIVE NOTE:  06/22/2013 Jimmy Castaneda [id number]  VITALS: BP 161/89  Pulse 66  Temp(Src) 97.4 F (36.3 C) (Oral)  Resp 18  SpO2 99%  See CBC results placed in chart from Dr. Glenna Castaneda H and P. BMET See CMET results for H and P.  INR: 1.2    06/16/2013 PTT: 30   06/16/2013   Jimmy Castaneda presents for multiple dental extractions with alveoloplasty in the operating room.   SUBJECTIVE: The patient denies any acute medical or dental changes and agrees to proceed with treatment as planned.  EXAM: No sign of acute dental changes.  ASSESSMENT: Patient is affected by  chronic apical periodontitis, multiple retained root segments, chronic periodontitis, dental caries,and tooth mobility.  PLAN: Patient agrees to proceed with treatment as planned in the operating room as previously discussed and accepts the risks, benefits, complications of the proposed treatment.   Jimmy Castaneda, DDS

## 2013-06-22 NOTE — Anesthesia Postprocedure Evaluation (Signed)
Anesthesia Post Note  Patient: Jimmy Castaneda  Procedure(s) Performed: Procedure(s) (LRB): Extraction of tooth #'s 3,4,5,8,9,10,12,21,22,23,27 with alveoloplasty, mandibular left torus reduction (N/A)  Anesthesia type: General  Patient location: PACU  Post pain: Pain level controlled  Post assessment: Post-op Vital signs reviewed  Last Vitals:  Filed Vitals:   06/22/13 1015  BP: 162/86  Pulse: 69  Temp: 36.3 C  Resp: 15    Post vital signs: Reviewed  Level of consciousness: sedated  Complications: No apparent anesthesia complications

## 2013-06-22 NOTE — Op Note (Signed)
Patient:            Jimmy Castaneda Date of Birth:  [birth year] MRN:                [id number]   DATE OF PROCEDURE:  06/22/2013               OPERATIVE REPORT   PREOPERATIVE DIAGNOSES: 1. Osteoarthritis of the left hip 2. Pre-total hip replacement dental protocol 3. Chronic apical periodontitis 4. Multiple retained root segments 5. Chronic periodontitis  6. Mandibular left lingual torus  POSTOPERATIVE DIAGNOSES: 1. Osteoarthritis of the left hip 2. Pre-total hip replacement dental protocol 3. Chronic apical periodontitis 4. Multiple retained root segments 5. Chronic periodontitis  6. Mandibular left lingual torus  OPERATIONS: 1. Multiple extraction of tooth numbers 3, 4, 5, 8, 9, 10, 12, 21, 22, 23, and 27. 2. 4 Quadrants of alveoloplasty 3. Mandibular left lingual torus reduction    SURGEON: Lenn Cal, DDS  ASSISTANT: Camie Patience, (dental assistant)  ANESTHESIA: General anesthesia via nasoendotracheal tube.  MEDICATIONS: 1. Ancef 2 g IV prior to invasive dental procedures. 2. Local anesthesia with a total utilization of 5 carpules each containing 34 mg of lidocaine with 0.017 mg of epinephrine as well as 2 carpules each containing 9 mg of bupivacaine with 0.009 mg of epinephrine.  SPECIMENS: There are 11 teeth that were discarded.  DRAINS: None  CULTURES: None  COMPLICATIONS: None   ESTIMATED BLOOD LOSS: 50 mLs.  INTRAVENOUS FLUIDS: 1800 mLs of Lactated ringers solution.  INDICATIONS: The patient was recently diagnosed with osteoarthritis of the left hip with anticipated total hip replacement.  A dental consultation was then requested to evaluate poor dentition.  The patient was examined and treatment planned for multiple extraction of remaining teeth with alveoloplasty and pre-prosthetic surgery as indicated.  This treatment plan was formulated to decrease the risks and complications associated with dental infection from affecting the patient's  systemic health and the anticipated total hip replacement.  OPERATIVE FINDINGS: Patient was examined operating room number 6.  The teeth were identified for extraction. The patient was noted be affected by chronic apical periodontitis, multiple retained root segments, chronic periodontitis, dental caries, and mandibular left lingual torus.   DESCRIPTION OF PROCEDURE: Patient was brought to the main operating room number 6. Patient was then placed in the supine position on the operating table. General anesthesia was then induced per the anesthesia team. The patient was then prepped and draped in the usual manner for dental medicine procedure. A timeout was performed. The patient was identified and procedures were verified. A throat pack was placed at this time. The oral cavity was then thoroughly examined with the findings noted above. The patient was then ready for dental medicine procedure as follows:  Local anesthesia was then administered sequentially with a total utilization of 5 carpules each containing 34 mg of lidocaine with 0.017 mg of epinephrine as well as 2 carpules  each containing 9 mg bupivacaine with 0.009 mg of epinephrine.  The Maxillary left and right quadrants first approached. Anesthesia was then delivered utilizing infiltration with lidocaine with epinephrine. A #15 blade incision was then made from the maxillary right tuberosity and extended to the mesial of #14.  A  surgical flap was then carefully reflected. Appropriate amounts of buccal and interseptal bone were then removed utilizing a surgical handpiece and bur and copious amounts of sterile water around tooth numbers 3, 4, 5.  The teeth were then subluxated with a series  of straight elevators. Tooth numbers 12, 10, 9, 8, 5, 4 were then removed with a 150 forceps without complications. Tooth #3 was then removed with a 53R forceps without complications. Alveoloplasty was then performed utilizing a ronguers and bone file. The  surgical site was then irrigated with copious amounts of sterile saline. The tissues were approximated and trimmed appropriately. The surgical site was then closed from the maxillary right tuberosity and extended to the mesial #8 utilizing 3-0 chromic gut suture in a continuous interrupted suture technique x1. The maxillary left surgical site was then closed from the distal of #13 and extended to the mesial #9 utilizing 3-0 chromic gut suture in a continuous interrupted suture technique x1.  At this point time, the mandibular quadrants were approached. The patient was given bilateral inferior alveolar nerve blocks and long buccal nerve blocks utilizing the bupivacaine with epinephrine. Further infiltration was then achieved utilizing the lidocaine with epinephrine. A 15 blade incision was then made from the distal of number 20 and extended to the distal of #28.  A surgical flap was then carefully reflected. Appropriate amounts of buccal and interseptal bone were then removed utilizing a surgical handpiece and copious amount of sterile water around tooth numbers 21, 22. Tooth numbers  21, 22, 23, and 27 were then subluxated with a series of straight elevators. Tooth numbers 21, 22, 23, and 27 were then removed with a 151 forceps without complications. Alveoloplasty was then performed utilizing a rongeurs and bone file. The mandibular left lingual flap was then reflected to expose the mandibular torus. This was then reduced utilizing a surgical handpiece and bur and copious amounts sterile water along with a rongeur and bone file. The tissues were approximated and trimmed appropriately. The surgical sites were then irrigated with copious amounts of sterile saline x4. The mandibular left surgical site was then closed from the distal of #20 and extended to the mesial of #24 utilizing 3-0 chromic gut suture in a continuous interrupted suture technique x1. The mandibular right surgical site was then closed from the  distal of #20 and extended the mesial numbers 25 utilizing 3-0 chromic gut suture in a continuous interrupted suture technique x1. 2 individual interrupted sutures are then placed to further closed surgical site as needed.  At this point time, the entire mouth was irrigated with copious amounts of sterile saline. The patient was examined for complications, seeing none, the dental medicine procedure was deemed to be complete. The throat pack was removed at this time. A series of 4 x 4 gauze were placed in the mouth to aid hemostasis. The patient was then handed over to the anesthesia team for final disposition. After an appropriate amount of time, the patient was extubated and taken to the postanesthsia care unit with stable vital signs and a good condition. All counts were correct for the dental medicine procedure. Patient was then given Percocet 10/325 #30. Patient is to take one tablet every 4 hours as needed for pain. Caution drowsiness. Avoid drinking and driving while on pain medication. Patient is to return to clinic in 7-10 days for evaluation for suture removal.  Lenn Cal, DDS.

## 2013-06-22 NOTE — Transfer of Care (Signed)
Immediate Anesthesia Transfer of Care Note  Patient: Jimmy Castaneda  Procedure(s) Performed: Procedure(s): Extraction of tooth #'s 3,4,5,8,9,10,12,21,22,23,27 with alveoloplasty, mandibular left torus reduction (N/A)  Patient Location: PACU  Anesthesia Type:General  Level of Consciousness: awake, alert , oriented and patient cooperative  Airway & Oxygen Therapy: Patient Spontanous Breathing and Patient connected to face mask oxygen  Post-op Assessment: Report given to PACU RN, Post -op Vital signs reviewed and stable and Patient moving all extremities  Post vital signs: Reviewed and stable  Complications: No apparent anesthesia complications

## 2013-06-22 NOTE — Discharge Instructions (Signed)

## 2013-06-23 ENCOUNTER — Encounter (HOSPITAL_COMMUNITY): Payer: Self-pay | Admitting: Dentistry

## 2013-07-03 ENCOUNTER — Ambulatory Visit (HOSPITAL_COMMUNITY): Payer: Medicaid - Dental | Admitting: Dentistry

## 2013-07-03 ENCOUNTER — Encounter (HOSPITAL_COMMUNITY): Payer: Self-pay | Admitting: Dentistry

## 2013-07-03 VITALS — BP 145/79 | HR 54 | Temp 97.6°F

## 2013-07-03 DIAGNOSIS — K08199 Complete loss of teeth due to other specified cause, unspecified class: Secondary | ICD-10-CM

## 2013-07-03 DIAGNOSIS — K08109 Complete loss of teeth, unspecified cause, unspecified class: Secondary | ICD-10-CM

## 2013-07-03 DIAGNOSIS — K000 Anodontia: Secondary | ICD-10-CM

## 2013-07-03 DIAGNOSIS — K08409 Partial loss of teeth, unspecified cause, unspecified class: Secondary | ICD-10-CM

## 2013-07-03 DIAGNOSIS — T148XXD Other injury of unspecified body region, subsequent encounter: Secondary | ICD-10-CM

## 2013-07-03 DIAGNOSIS — M169 Osteoarthritis of hip, unspecified: Secondary | ICD-10-CM

## 2013-07-03 DIAGNOSIS — Z0189 Encounter for other specified special examinations: Secondary | ICD-10-CM

## 2013-07-03 MED ORDER — OXYCODONE-ACETAMINOPHEN 10-325 MG PO TABS: 1.0000 | ORAL_TABLET | Freq: Four times a day (QID) | ORAL | Status: DC | PRN

## 2013-07-03 NOTE — Patient Instructions (Addendum)
PLAN: 1. Continue saltwater rinses every 2 hours while awake to aid healing. Brush his tongue once a day. 2. Maintain soft diet at this time. Avoid trauma to the mandibular anterior area as best able. Consider food supplements such as boost or ensure plus for a dietary supplement. 3. Will represcribe Percocet 10/325 Taking 1 tablet every 6 hours as needed for pain. Dispense #40. No refills. The patient is currently not cleared for hip replacement surgery at this time.  Lenn Cal, DDS

## 2013-07-03 NOTE — Progress Notes (Signed)
POST OPERATIVE NOTE:  07/03/2013 Jimmy Castaneda [id number]  VITALS: BP 145/79  Pulse 54  Temp(Src) 97.6 F (36.4 C) (Oral)   Jimmy Castaneda is status post multiple extractions with alveoloplasty and pre-prosthetic surgery as indicated in the operating room on 06/22/2013. The patient now presents for evaluation of healing and for suture removal.  SUBJECTIVE: Patient is complaining of persistent irritation to the mandibular anterior and maxillary anterior areas. The patient indicates that he has several stitches that still remain.  Patient denies significant eating over these areas as it has been tender.   EXAM: There is no sign of infection, heme, or ooze but there is delayed healing involving the mandibular anterior area from 22 through 28. The mandibular anterior surgical site has opened up secondary to multiple from the lingual frenum area and active tongue thrusting. The maxillary healing is by generalized primary closure.  ASSESSMENT: Post operative course is consistent with procedures performed in the operating room on 06/22/2013. There is delayed healing from the maxillary anterior area a #22 through 28. I discussed the risks, benefits, complications of resuturing the mandibular anterior area and patient agreed to this procedure today.  PROCEDURE: The patient was given a chlorhexidine gluconate rinse for 30 seconds. Sutures were then removed without complication.  Patient was then given bilateral mandibular  Mental nerve blocks with 72 mg of Xylocaine with 0.036 mg of epinephrine.  The surgical site was then sutured from the area numbers 21 through 28 utilizing 3-0 chromic gut material in a continuous interrupted suture technique x1. Good approximation of the tissues was noted at this time.  Patient tolerated the procedure well. Patient was instructed not to over this area for 10 days and to avoid placing his tongue in the area the surgical site.  PLAN: 1. Continue  saltwater rinses every 2 hours while awake to aid healing. 2. Maintain soft diet at this time. Avoid trauma to the mandibular anterior area as best able. 3. Will represcribe Percocet 10/325 Taking 1 tablet every 6 hours as needed for pain. Dispense #40. No refills. The patient is currently not cleared for hip replacement surgery at this time.  Lenn Cal, DDS

## 2013-07-17 ENCOUNTER — Encounter (INDEPENDENT_AMBULATORY_CARE_PROVIDER_SITE_OTHER): Payer: Self-pay

## 2013-07-17 ENCOUNTER — Ambulatory Visit (HOSPITAL_COMMUNITY): Payer: Medicaid - Dental | Admitting: Dentistry

## 2013-07-17 ENCOUNTER — Encounter (HOSPITAL_COMMUNITY): Payer: Self-pay | Admitting: Dentistry

## 2013-07-17 VITALS — BP 155/82 | HR 47 | Temp 98.0°F

## 2013-07-17 DIAGNOSIS — M169 Osteoarthritis of hip, unspecified: Secondary | ICD-10-CM

## 2013-07-17 DIAGNOSIS — Z463 Encounter for fitting and adjustment of dental prosthetic device: Secondary | ICD-10-CM

## 2013-07-17 DIAGNOSIS — K000 Anodontia: Secondary | ICD-10-CM

## 2013-07-17 DIAGNOSIS — K08109 Complete loss of teeth, unspecified cause, unspecified class: Secondary | ICD-10-CM

## 2013-07-17 DIAGNOSIS — T148XXD Other injury of unspecified body region, subsequent encounter: Secondary | ICD-10-CM

## 2013-07-17 DIAGNOSIS — Z0189 Encounter for other specified special examinations: Secondary | ICD-10-CM

## 2013-07-17 NOTE — Patient Instructions (Signed)
Patient to continue saltwater rinses as needed to aid healing. Patient to brush his tongue daily.    Patient is to contact social services concerning Medicaid coverage for the upper and lower complete dentures. As of today, the patient did not have dental Medicaid. Patient to return to clinic for upper and lower complete denture impressions as scheduled.

## 2013-07-17 NOTE — Progress Notes (Signed)
07/17/2013  Patient:            Jimmy Castaneda Date of Birth:  [birth year] MRN:                [id number]  BP 155/82  Pulse 47  Temp(Src) 98 F (36.7 C) (Oral)   Jimmy Castaneda presents for reevaluation of healing from extractions on 06/22/2013.   SUBJECTIVE: Patient denies having any problems with healing at this time. Patient is ready to start upper and lower complete denture fabrication. OBJECTIVE: There is no sign of infection, heme, or ooze. Sutures are all gone. Patient has generalized primary closure. There is no longer any evidence of delayed healing. Patient is edentulous. Discussed procedures involved in upper and lower denture fabrication and prognosis for successful ability to wear dentures. Price for dentures confirmed.  Patient is aware that he does not have dental Medicaid at this time.  The patient was instructed to contact social services concerning this lack of dental coverage. Patient is aware that he will be responsible for the full Price of upper lower complete dentures at this time.  Patient agrees to proceed with upper and lower denture fabrication. Procedure:  Upper and lower denture primary impressions in alginate. Lab pour. To [facility] for upper and lower denture custom tray fabrication. RTC for upper and lower denture final impressions.  Lenn Cal, DDS

## 2013-07-31 ENCOUNTER — Encounter (HOSPITAL_COMMUNITY): Payer: Self-pay | Admitting: Dentistry

## 2013-07-31 ENCOUNTER — Ambulatory Visit (HOSPITAL_COMMUNITY): Payer: Medicaid - Dental | Admitting: Dentistry

## 2013-07-31 ENCOUNTER — Encounter (INDEPENDENT_AMBULATORY_CARE_PROVIDER_SITE_OTHER): Payer: Self-pay

## 2013-07-31 VITALS — BP 140/78 | HR 52 | Temp 97.9°F

## 2013-07-31 DIAGNOSIS — Z463 Encounter for fitting and adjustment of dental prosthetic device: Secondary | ICD-10-CM

## 2013-07-31 DIAGNOSIS — K08109 Complete loss of teeth, unspecified cause, unspecified class: Secondary | ICD-10-CM

## 2013-07-31 DIAGNOSIS — K000 Anodontia: Principal | ICD-10-CM

## 2013-07-31 DIAGNOSIS — M169 Osteoarthritis of hip, unspecified: Secondary | ICD-10-CM

## 2013-07-31 NOTE — Patient Instructions (Signed)
Return to clinic as scheduled for continuation of upper and lower complete denture fabrication. Dr. Kulinski 

## 2013-07-31 NOTE — Progress Notes (Signed)
07/31/2013  Patient:            Jimmy Castaneda Date of Birth:  [birth year] MRN:                [id number]  BP 140/78  Pulse 52  Temp(Src) 97.9 F (36.6 C) (Oral)  [name] presents for continued upper and lower complete denture fabrication. Procedure:  Upper and lower border molding and final impressions in Aquasil. Patient tolerated procedure well. To [facility] for custom baseplates with rims. Return to clinic for upper and lower complete denture jaw relations.  Lenn Cal, DDS

## 2013-08-08 ENCOUNTER — Encounter (HOSPITAL_COMMUNITY): Payer: Self-pay | Admitting: Dentistry

## 2013-08-15 ENCOUNTER — Ambulatory Visit (HOSPITAL_COMMUNITY): Payer: Medicaid - Dental | Admitting: Dentistry

## 2013-08-15 ENCOUNTER — Encounter (INDEPENDENT_AMBULATORY_CARE_PROVIDER_SITE_OTHER): Payer: Self-pay

## 2013-08-15 VITALS — BP 131/72 | HR 51 | Temp 98.3°F

## 2013-08-15 DIAGNOSIS — M169 Osteoarthritis of hip, unspecified: Secondary | ICD-10-CM

## 2013-08-15 DIAGNOSIS — K08109 Complete loss of teeth, unspecified cause, unspecified class: Secondary | ICD-10-CM

## 2013-08-15 DIAGNOSIS — K000 Anodontia: Principal | ICD-10-CM

## 2013-08-15 DIAGNOSIS — Z463 Encounter for fitting and adjustment of dental prosthetic device: Secondary | ICD-10-CM

## 2013-08-15 NOTE — Patient Instructions (Signed)
Return to clinic as scheduled for continued upper and lower complete denture fabrication. Dr. Kulinski 

## 2013-08-15 NOTE — Progress Notes (Signed)
08/15/2013  Patient:            Jimmy Castaneda Date of Birth:  19-Jul-1945 MRN:                [id number]  BP 131/72  Pulse 51  Temp(Src) 98.3 F (36.8 C) (Oral)  [name] presents for continued denture fabrication. Procedure:  Upper and lower denture Jaw relations with aluwax bite registration. Patient agrees to tooth selection of 22 E, H, and 10 degree posteriors to match with Portrait A2 shade. Patient tolerated procedure well. RTC for denture wax try in.   Lenn Cal, DDS

## 2013-08-23 ENCOUNTER — Encounter (HOSPITAL_COMMUNITY): Payer: Self-pay | Admitting: Dentistry

## 2013-08-23 ENCOUNTER — Ambulatory Visit (HOSPITAL_COMMUNITY): Payer: Medicaid - Dental | Admitting: Dentistry

## 2013-08-23 VITALS — BP 138/73 | HR 51 | Temp 98.2°F

## 2013-08-23 DIAGNOSIS — Z463 Encounter for fitting and adjustment of dental prosthetic device: Secondary | ICD-10-CM

## 2013-08-23 DIAGNOSIS — M169 Osteoarthritis of hip, unspecified: Secondary | ICD-10-CM

## 2013-08-23 DIAGNOSIS — K000 Anodontia: Secondary | ICD-10-CM

## 2013-08-23 DIAGNOSIS — K08109 Complete loss of teeth, unspecified cause, unspecified class: Secondary | ICD-10-CM

## 2013-08-23 NOTE — Patient Instructions (Addendum)
Return to clinic as scheduled for continued upper and lower complete denture fabrication. Dr. Kaseem Vastine 

## 2013-08-23 NOTE — Progress Notes (Signed)
08/23/2013  Patient:            Jimmy Castaneda Date of Birth:  [birth year] MRN:                [id number]  BP 138/73  Pulse 51  Temp(Src) 98.2 F (36.8 C) (Oral)  Hiram E Basford presents for continued upper and lower denture fabrication. Procedure:  Upper and lower denture wax tryin. Patient accepts esthetics, phonetics, fit and function. Patient agrees to process "as is" in 50:50 [place] 199. Patient to RTC for  upper and lower denture insertion.  Lenn Cal, DDS

## 2013-08-31 ENCOUNTER — Ambulatory Visit (HOSPITAL_COMMUNITY): Payer: Medicaid - Dental | Admitting: Dentistry

## 2013-08-31 ENCOUNTER — Encounter (HOSPITAL_COMMUNITY): Payer: Self-pay | Admitting: Dentistry

## 2013-08-31 VITALS — BP 129/71 | HR 51 | Temp 98.2°F

## 2013-08-31 DIAGNOSIS — K000 Anodontia: Secondary | ICD-10-CM

## 2013-08-31 DIAGNOSIS — Z463 Encounter for fitting and adjustment of dental prosthetic device: Secondary | ICD-10-CM

## 2013-08-31 DIAGNOSIS — M169 Osteoarthritis of hip, unspecified: Secondary | ICD-10-CM

## 2013-08-31 DIAGNOSIS — K08109 Complete loss of teeth, unspecified cause, unspecified class: Secondary | ICD-10-CM

## 2013-08-31 NOTE — Patient Instructions (Signed)
Instructions for Denture Use and Care  Congratulations, you are on the way to oral rehabilitation!  You have just received a new set of complete or partial dentures.  These prostheses will help to improve both your appearance and chewing ability.  These instructions will help you get adjusted to your dentures as well as care for them properly.  Please read these instructions carefully and completely as soon as you get home.  If you or your caregiver have any questions please notify the [facility] at [phone].  HOW YOUR DENTURES LOOK AND FEEL Soon after you begin wearing your dentures, you may feel that your dentures are too large or even loose.  As our mouth and facial muscles become accustomed to the dentures, these feelings will go away.  You also may feel that you are salivating more than you normally do.  This feeling should go away as you get used to having the dentures in your mouth.  You may bite your cheek or your tongue; this will eventually resolve itself as you wear your dentures.  Some soreness is to be expected, but you should not hurt.  If your mouth hurts, call your dentist.  A denture adhesive may occasionally be necessary to hold your dentures in place more securely.  The dentist will let you know when one is recommended for you.  SPEAKING Wearing dentures will change the sound of your voice initially.  This will be noticed by you more than anyone else.  Bite and swallow before you speak, in order to place your dentures in position so that you may speak more clearly.  Practice speaking by reading aloud or counting from 1 to 100 very slowly and distinctly.  After some practice your mouth will become accustomed to your dentures and you will speak more clearly.  EATING Chewing will definitely be different after you receive your dentures.  With a little practice and patience you should be able to eat just about any kind of food.  Begin by eating small quantities of food  that are cut into small pieces.  Star with soft foods such as eggs, cooked vegetables, or puddings.  As you gain confidence advance  Your diet to whatever texture foods you can tolerate.  DENTURE CARE Dentures can collect plaque and calculus much the same as natural teeth can.  If not removed on a regular basis, your dentures will not look or feel clean, and you will experience denture odor.  It is very important that you remove your dentures at bedtime and clean them thoroughly.  You should: 1. Clean your dentures over a sink full of water so if dropped, breakage will be prevented. 2. Rinse your dentures with cool water to remove any large food particles. 3. Use soap and water or a denture cleanser or paste to clean the dentures.  Do not use regular toothpaste as it may abrade the denture base or teeth. 4. Use a moistened denture brush to clean all surfaces (inside and outside). 5. Rinse thoroughly to remove any remaining soap or denture cleanser. 6. Use a soft bristle toothbrush to gently brush any natural teeth, gums, tongue, and palate at bedtime and before reinserting your dentures. 7. Do not sleep with your dentures in your mouth at night.  Remove your dentures and soak them overnight in a denture cup filled with water or denture solution as recommended by your dentist.  This routine will become second nature and will increase the life and comfort   of your dentures.  Please do not try to adjust these dentures yourself; you could damage them.  FOLLOW-UP You should call or make an appointment with your dentist.  Your dentist would like to see you at least once a year for a check-up and examination. 

## 2013-08-31 NOTE — Progress Notes (Signed)
08/31/2013  Patient:            Jimmy Castaneda Date of Birth:  10-17-1945 MRN:                [id number]  BP 129/71  Pulse 51  Temp(Src) 98.2 F (36.8 C) (Oral)  Loy E Brew presents for insertion of upper and lower complete dentures. Procedure: Pressure indicating paste was applied to the dentures. Adjustments were made as needed. [place] Occlusion evaluated and adjustments made as needed for Centric Relation and protrusive strokes. Good esthetics, phonetics, fit, and function noted. Patient accepts results. Post op instructions provided in written and verbal formats on use and care of dentures. Gave patient denture brush and cup. Patient to keep dentures out if sore spots develop. Use salt water rinses as needed to aid healing. Return to clinic as scheduled for denture adjustment.  Call if problems arise before then. Patient dismissed in stable condition.  Lenn Cal, DDS

## 2013-09-04 ENCOUNTER — Encounter (HOSPITAL_COMMUNITY): Payer: Self-pay | Admitting: Dentistry

## 2013-09-05 ENCOUNTER — Encounter (HOSPITAL_COMMUNITY): Payer: Self-pay | Admitting: Dentistry

## 2013-09-05 ENCOUNTER — Ambulatory Visit (HOSPITAL_COMMUNITY): Payer: Medicaid - Dental | Admitting: Dentistry

## 2013-09-05 VITALS — BP 147/72 | HR 52 | Temp 98.3°F

## 2013-09-05 DIAGNOSIS — K000 Anodontia: Principal | ICD-10-CM

## 2013-09-05 DIAGNOSIS — Z463 Encounter for fitting and adjustment of dental prosthetic device: Secondary | ICD-10-CM

## 2013-09-05 DIAGNOSIS — Z972 Presence of dental prosthetic device (complete) (partial): Secondary | ICD-10-CM

## 2013-09-05 DIAGNOSIS — K08109 Complete loss of teeth, unspecified cause, unspecified class: Secondary | ICD-10-CM

## 2013-09-05 DIAGNOSIS — M169 Osteoarthritis of hip, unspecified: Secondary | ICD-10-CM

## 2013-09-05 NOTE — Patient Instructions (Signed)
Patient to keep dentures out if sore spots develop. Use salt water rinses as needed to aid healing. Return to clinic as scheduled for denture adjustment.   Call if problems arise before then.  Ronald F. Kulinski, DDS  

## 2013-09-05 NOTE — Progress Notes (Signed)
09/05/2013  Patient:            Jimmy Castaneda Date of Birth:  1946/04/03 MRN:                [id number]  BP 147/72  Pulse 52  Temp(Src) 98.3 F (36.8 C) (Oral)  AYDDEN CUMPIAN presents for evaluation of recently inserted upper and lower complete dentures. SUBJECTIVE: Patient is not complaining of any denture irritation. Patient likes the new look of his dentures. Patient is complaining of some difficulty keeping lower denture in place for healing. OBJECTIVE: There is no sign of denture irritation or erythema Procedure: Pressure indicating paste was applied to the dentures. Adjustments were made as needed. Thick PIP was applied to the denture borders and adjusted as needed. Dentures were polished. Occlusion evaluated and adjustments made as needed for Centric Relation and protrusive strokes. Patient accepts results. Patient to keep dentures out if sore spots develop. Use salt water rinses as needed to aid healing. Return to clinic as scheduled for denture adjustment.  Call if problems arise before then. Patient dismissed in stable condition.  Lenn Cal, DDS

## 2013-09-15 NOTE — Progress Notes (Signed)
[place], [place]  - Please enter preop orders in Epic for [facility] - add on surgery for 5/26.  Thanks.

## 2013-09-19 ENCOUNTER — Encounter (HOSPITAL_COMMUNITY): Payer: Self-pay | Admitting: Dentistry

## 2013-09-19 ENCOUNTER — Ambulatory Visit (HOSPITAL_COMMUNITY): Payer: Medicaid - Dental | Admitting: Dentistry

## 2013-09-19 ENCOUNTER — Encounter (INDEPENDENT_AMBULATORY_CARE_PROVIDER_SITE_OTHER): Payer: Self-pay

## 2013-09-19 VITALS — BP 125/71 | HR 51 | Temp 98.1°F

## 2013-09-19 DIAGNOSIS — K08109 Complete loss of teeth, unspecified cause, unspecified class: Secondary | ICD-10-CM

## 2013-09-19 DIAGNOSIS — Z463 Encounter for fitting and adjustment of dental prosthetic device: Secondary | ICD-10-CM

## 2013-09-19 DIAGNOSIS — K000 Anodontia: Secondary | ICD-10-CM

## 2013-09-19 DIAGNOSIS — Z972 Presence of dental prosthetic device (complete) (partial): Secondary | ICD-10-CM

## 2013-09-19 DIAGNOSIS — M169 Osteoarthritis of hip, unspecified: Secondary | ICD-10-CM

## 2013-09-19 NOTE — Patient Instructions (Signed)
Patient to keep dentures out if sore spots develop. Use salt water rinses as needed to aid healing. Return to clinic as scheduled for denture adjustment.   Call if problems arise before then.  Jimmy Castaneda F. [name], DDS  

## 2013-09-19 NOTE — Progress Notes (Signed)
09/19/2013  Patient:            Jimmy Castaneda Date of Birth:  08/14/1945 MRN:                [id number]  BP 125/71  Pulse 51  Temp(Src) 98.1 F (36.7 C) (Oral)  Jimmy Castaneda presents for evaluation of recently inserted upper and lower complete dentures. Patient indicates that he is currently scheduled for hip replacement surgery on 10/03/2013 were Dr. Alvan Dame.  SUBJECTIVE: Patient is not complaining of any denture irritation. Patient likes his dentures. Still trying to get used to the lower denture.  OBJECTIVE: There is no sign of denture irritation or erythema Procedure: Pressure indicating paste was applied to the dentures. Adjustments were made as needed. The patient is still with tendency to protrude to the mandibular right side. Patient was instructed on finding maximum intercuspation position. Occlusion evaluated and adjustments made as needed for Centric Relation and protrusive strokes. Patient accepts results. Patient to keep dentures out if sore spots develop. Use salt water rinses as needed to aid healing. Return to clinic as scheduled for denture adjustment.  Call if problems arise before then. Patient dismissed in stable condition.  Lenn Cal, DDS

## 2013-09-22 ENCOUNTER — Encounter (HOSPITAL_COMMUNITY): Payer: Self-pay | Admitting: Pharmacy Technician

## 2013-09-25 ENCOUNTER — Other Ambulatory Visit (HOSPITAL_COMMUNITY): Payer: Self-pay | Admitting: Orthopedic Surgery

## 2013-09-25 NOTE — Patient Instructions (Addendum)
[place]  09/25/2013   Your procedure is scheduled on: Tuesday May 26th, 2015  Report to [facility] Main Entrance and follow signs to  [place] at  855 AM.  Call this number if you have problems the morning of surgery [phone]   Remember: short stay will draw your blood type morning of surgery.  Do not eat food or drink liquids :After Midnight.     Take these medicines the morning of surgery with A SIP OF WATER: amlodipine, metoprolol                               You may not have any metal on your body including hair pins and piercings  Do not wear jewelry, make-up, lotions, powders, or deodorant.   Men may shave face and neck.  Do not bring valuables to the hospital. [name].  Contacts, dentures or bridgework may not be worn into surgery.  Leave suitcase in the car. After surgery it may be brought to your room.  For patients admitted to the hospital, checkout time is 11:00 AM the day of discharge.   Patients discharged the day of surgery will not be allowed to drive home.  Name and phone number of your driver:  Special Instructions: N/A  ________________________________________________________________________  [facility] - Preparing for Surgery Before surgery, you can play an important role.  Because skin is not sterile, your skin needs to be as free of germs as possible.  You can reduce the number of germs on your skin by washing with CHG (chlorahexidine gluconate) soap before surgery.  CHG is an antiseptic cleaner which kills germs and bonds with the skin to continue killing germs even after washing. Please DO NOT use if you have an allergy to CHG or antibacterial soaps.  If your skin becomes reddened/irritated stop using the CHG and inform your nurse when you arrive at Short Stay. Do not shave (including legs and underarms) for at least 48 hours prior to the first CHG shower.  You may shave your face. Please  follow these instructions carefully:  1.  Shower with CHG Soap the night before surgery and the  morning of Surgery.  2.  If you choose to wash your hair, wash your hair first as usual with your  normal  shampoo.  3.  After you shampoo, rinse your hair and body thoroughly to remove the  shampoo.                           4.  Use CHG as you would any other liquid soap.  You can apply chg directly  to the skin and wash                       Gently with a scrungie or clean washcloth.  5.  Apply the CHG Soap to your body ONLY FROM THE NECK DOWN.   Do not use on open                           Wound or open sores. Avoid contact with eyes, ears mouth and genitals (private parts).                        Genitals (private parts) with your  normal soap.             6.  Wash thoroughly, paying special attention to the area where your surgery  will be performed.  7.  Thoroughly rinse your body with warm water from the neck down.  8.  DO NOT shower/wash with your normal soap after using and rinsing off  the CHG Soap.                9.  Pat yourself dry with a clean towel.            10.  Wear clean pajamas.            11.  Place clean sheets on your bed the night of your first shower and do not  sleep with pets. Day of Surgery : Do not apply any lotions/deodorants the morning of surgery.  Please wear clean clothes to the hospital/surgery center.  FAILURE TO FOLLOW THESE INSTRUCTIONS MAY RESULT IN THE CANCELLATION OF YOUR SURGERY PATIENT SIGNATURE_________________________________  NURSE SIGNATURE__________________________________  ________________________________________________________________________   Jimmy Castaneda  An incentive spirometer is a tool that can help keep your lungs clear and active. This tool measures how well you are filling your lungs with each breath. Taking long deep breaths may help reverse or decrease the chance of developing breathing (pulmonary) problems (especially  infection) following:  A long period of time when you are unable to move or be active. BEFORE THE PROCEDURE   If the spirometer includes an indicator to show your best effort, your nurse or respiratory therapist will set it to a desired goal.  If possible, sit up straight or lean slightly forward. Try not to slouch.  Hold the incentive spirometer in an upright position. INSTRUCTIONS FOR USE  1. Sit on the edge of your bed if possible, or sit up as far as you can in bed or on a chair. 2. Hold the incentive spirometer in an upright position. 3. Breathe out normally. 4. Place the mouthpiece in your mouth and seal your lips tightly around it. 5. Breathe in slowly and as deeply as possible, raising the piston or the ball toward the top of the column. 6. Hold your breath for 3-5 seconds or for as long as possible. Allow the piston or ball to fall to the bottom of the column. 7. Remove the mouthpiece from your mouth and breathe out normally. 8. Rest for a few seconds and repeat Steps 1 through 7 at least 10 times every 1-2 hours when you are awake. Take your time and take a few normal breaths between deep breaths. 9. The spirometer may include an indicator to show your best effort. Use the indicator as a goal to work toward during each repetition. 10. After each set of 10 deep breaths, practice coughing to be sure your lungs are clear. If you have an incision (the cut made at the time of surgery), support your incision when coughing by placing a pillow or rolled up towels firmly against it. Once you are able to get out of bed, walk around indoors and cough well. You may stop using the incentive spirometer when instructed by your caregiver.  RISKS AND COMPLICATIONS  Take your time so you do not get dizzy or light-headed.  If you are in pain, you may need to take or ask for pain medication before doing incentive spirometry. It is harder to take a deep breath if you are having pain. AFTER  USE  Rest and breathe slowly  and easily.  It can be helpful to keep track of a log of your progress. Your caregiver can provide you with a simple table to help with this. If you are using the spirometer at home, follow these instructions: [place] IF:   You are having difficultly using the spirometer.  You have trouble using the spirometer as often as instructed.  Your pain medication is not giving enough relief while using the spirometer.  You develop fever of 100.5 F (38.1 C) or higher. SEEK IMMEDIATE MEDICAL CARE IF:   You cough up bloody sputum that had not been present before.  You develop fever of 102 F (38.9 C) or greater.  You develop worsening pain at or near the incision site. MAKE SURE YOU:   Understand these instructions.  Will watch your condition.  Will get help right away if you are not doing well or get worse. Document Released: 09/07/2006 Document Revised: 07/20/2011 Document Reviewed: 11/08/2006 Exit[employer] Patient Information 2014 Exit[employer], [place].   ________________________________________________________________________  WHAT IS A BLOOD TRANSFUSION? Blood Transfusion Information  A transfusion is the replacement of blood or some of its parts. Blood is made up of multiple cells which provide different functions.  Red blood cells carry oxygen and are used for blood loss replacement.  White blood cells fight against infection.  Platelets control bleeding.  Plasma helps clot blood.  Other blood products are available for specialized needs, such as hemophilia or other clotting disorders. BEFORE THE TRANSFUSION  Who gives blood for transfusions?   Healthy volunteers who are fully evaluated to make sure their blood is safe. This is blood bank blood. Transfusion therapy is the safest it has ever been in the practice of medicine. Before blood is taken from a donor, a complete history is taken to make sure that person has no history of diseases  nor engages in risky social behavior (examples are intravenous drug use or sexual activity with multiple partners). The donor's travel history is screened to minimize risk of transmitting infections, such as malaria. The donated blood is tested for signs of infectious diseases, such as HIV and hepatitis. The blood is then tested to be sure it is compatible with you in order to minimize the chance of a transfusion reaction. If you or a relative donates blood, this is often done in anticipation of surgery and is not appropriate for emergency situations. It takes many days to process the donated blood. RISKS AND COMPLICATIONS Although transfusion therapy is very safe and saves many lives, the main dangers of transfusion include:   Getting an infectious disease.  Developing a transfusion reaction. This is an allergic reaction to something in the blood you were given. Every precaution is taken to prevent this. The decision to have a blood transfusion has been considered carefully by your caregiver before blood is given. Blood is not given unless the benefits outweigh the risks. AFTER THE TRANSFUSION  Right after receiving a blood transfusion, you will usually feel much better and more energetic. This is especially true if your red blood cells have gotten low (anemic). The transfusion raises the level of the red blood cells which carry oxygen, and this usually causes an energy increase.  The nurse administering the transfusion will monitor you carefully for complications. HOME CARE INSTRUCTIONS  No special instructions are needed after a transfusion. You may find your energy is better. Speak with your caregiver about any limitations on activity for underlying diseases you may have. SEEK MEDICAL CARE IF:  Your condition is not improving after your transfusion.  You develop redness or irritation at the intravenous (IV) site. SEEK IMMEDIATE MEDICAL CARE IF:  Any of the following symptoms occur over the  next 12 hours:  Shaking chills.  You have a temperature by mouth above 102 F (38.9 C), not controlled by medicine.  Chest, back, or muscle pain.  People around you feel you are not acting correctly or are confused.  Shortness of breath or difficulty breathing.  Dizziness and fainting.  You get a rash or develop hives.  You have a decrease in urine output.  Your urine turns a dark color or changes to pink, red, or brown. Any of the following symptoms occur over the next 10 days:  You have a temperature by mouth above 102 F (38.9 C), not controlled by medicine.  Shortness of breath.  Weakness after normal activity.  The white part of the eye turns yellow (jaundice).  You have a decrease in the amount of urine or are urinating less often.  Your urine turns a dark color or changes to pink, red, or brown. Document Released: 04/24/2000 Document Revised: 07/20/2011 Document Reviewed: 12/12/2007 [facility] Patient Information 2014 [place], [place].  _______________________________________________________________________

## 2013-09-25 NOTE — Progress Notes (Signed)
Chest 2 view xray 06-19-13 epic ekg 06-19-13 epic Medical [facility] note dr Deforest Hoyles 09-15-13 on chart

## 2013-09-27 ENCOUNTER — Encounter (HOSPITAL_COMMUNITY)
Admission: RE | Admit: 2013-09-27 | Discharge: 2013-09-27 | Disposition: A | Discharge status: Home / Self Care | Payer: Medicare Other | Source: Ambulatory Visit | Attending: Orthopedic Surgery | Admitting: Orthopedic Surgery

## 2013-09-27 ENCOUNTER — Encounter (HOSPITAL_COMMUNITY): Payer: Self-pay

## 2013-09-27 DIAGNOSIS — M1610 Unilateral primary osteoarthritis, unspecified hip: Secondary | ICD-10-CM | POA: Insufficient documentation

## 2013-09-27 DIAGNOSIS — M169 Osteoarthritis of hip, unspecified: Secondary | ICD-10-CM | POA: Insufficient documentation

## 2013-09-27 DIAGNOSIS — Z01812 Encounter for preprocedural laboratory examination: Secondary | ICD-10-CM | POA: Insufficient documentation

## 2013-09-27 LAB — URINALYSIS, ROUTINE W REFLEX MICROSCOPIC
Bilirubin Urine: NEGATIVE
GLUCOSE, UA: NEGATIVE mg/dL
Hgb urine dipstick: NEGATIVE
KETONES UR: NEGATIVE mg/dL
Leukocytes, UA: NEGATIVE
NITRITE: NEGATIVE
Protein, ur: NEGATIVE mg/dL
Specific Gravity, Urine: 1.024 (ref 1.005–1.030)
Urobilinogen, UA: 1 mg/dL (ref 0.0–1.0)
pH: 6.5 (ref 5.0–8.0)

## 2013-09-27 LAB — CBC
HCT: 43.1 % (ref 39.0–52.0)
Hemoglobin: 15.1 g/dL (ref 13.0–17.0)
MCH: 34.8 pg — ABNORMAL HIGH (ref 26.0–34.0)
MCHC: 35 g/dL (ref 30.0–36.0)
MCV: 99.3 fL (ref 78.0–100.0)
Platelets: 239 10*3/uL (ref 150–400)
RBC: 4.34 MIL/uL (ref 4.22–5.81)
RDW: 11.9 % (ref 11.5–15.5)
WBC: 3.6 10*3/uL — ABNORMAL LOW (ref 4.0–10.5)

## 2013-09-27 LAB — PROTIME-INR
INR: 1.06 (ref 0.00–1.49)
Prothrombin Time: 13.6 seconds (ref 11.6–15.2)

## 2013-09-27 LAB — SURGICAL PCR SCREEN
MRSA, PCR: NEGATIVE
Staphylococcus aureus: NEGATIVE

## 2013-09-27 LAB — APTT: APTT: 28 s (ref 24–37)

## 2013-09-27 NOTE — Progress Notes (Signed)
Dr Deforest Hoyles 09-15-13 cmet, hemaglobin A1 C, PSA,  Lipid panel, on chart

## 2013-09-29 NOTE — H&P (Signed)
TOTAL HIP ADMISSION H&P  Patient is admitted for left total hip arthroplasty, anterior approach.  Subjective:  Chief Complaint:     Left hip OA / pain  HPI: Jimmy Castaneda, 68 y.o. male, has a history of pain and functional disability in the left hip(s) due to arthritis and patient has failed non-surgical conservative treatments for greater than 12 weeks to include NSAID's and/or analgesics and activity modification.  Onset of symptoms was gradual starting years ago with gradually worsening course since that time.The patient noted no past surgery on the left hip(s).  Patient currently rates pain in the left hip at 8 out of 10 with activity. Patient has night pain, worsening of pain with activity and weight bearing, trendelenberg gait, pain that interfers with activities of daily living and pain with passive range of motion. Patient has evidence of periarticular osteophytes and joint space narrowing by imaging studies. This condition presents safety issues increasing the risk of falls.  There is no current active infection.  Risks, benefits and expectations were discussed with the patient.  Risks including but not limited to the risk of anesthesia, blood clots, nerve damage, blood vessel damage, failure of the prosthesis, infection and up to and including death.  Patient understand the risks, benefits and expectations and wishes to proceed with surgery.   D/C Plans:      Home with HHPT  Post-op Meds:       No Rx given   Tranexamic Acid:      is to be given  Decadron:      is to be given  FYI:      ASA post-op       Oxycodone 5-15 mg post-op   Patient Active Problem List   Diagnosis Date Noted  . Osteoarthritis of hip 06/02/2013   Past Medical History  Diagnosis Date  . Hypertension   . Adenomatous colon polyp   . Vitiligo   . GERD (gastroesophageal reflux disease)     never required medication to treat  . Osteoarthritis     plans left hip replacement   . Rheumatoid arthritis   .  Hepatitis C     Past Surgical History  Procedure Laterality Date  . Polypectomy    . Colonoscopy  2014  . Excision of nodule rt wrist -local anesthesia  yrs ago  . Multiple extractions with alveoloplasty N/A 06/22/2013    Procedure: Extraction of tooth #'s 3,4,5,8,9,10,12,21,22,23,27 with alveoloplasty, mandibular left torus reduction;  Surgeon: Lenn Cal, DDS;  Location: [facility] ORS;  Service: Oral Surgery;  Laterality: N/A;    No prescriptions prior to admission   Allergies  Allergen Reactions  . Aspirin Hives    History  Substance Use Topics  . Smoking status: Former Smoker -- 0.50 packs/day for 35 years    Quit date: 06/03/1995  . Smokeless tobacco: Never Used  . Alcohol Use: 2.4 oz/week    4 Cans of beer per week     Comment: last used marjuana about 09-20-13--denies use of any other recreational drugs.;      Family History  Problem Relation Age of Onset  . Diabetes Mother   . Diabetes Father   . Hypertension Mother   . Hypertension Father   . Diabetes Sister      Review of Systems  Constitutional: Negative.   HENT: Negative.   Eyes: Negative.   Respiratory: Negative.   Cardiovascular: Negative.   Gastrointestinal: Positive for heartburn.  Genitourinary: Negative.   Musculoskeletal: Positive for  joint pain.  Skin: Negative.   Neurological: Negative.   Endo/Heme/Allergies: Negative.   Psychiatric/Behavioral: Negative.     Objective:  Physical Exam  Constitutional: He is oriented to person, place, and time. He appears well-developed and well-nourished.  HENT:  Head: Normocephalic and atraumatic.  Mouth/Throat: Oropharynx is clear and moist.  Eyes: Pupils are equal, round, and reactive to light.  Neck: Neck supple. No JVD present. No tracheal deviation present. No thyromegaly present.  Cardiovascular: Normal rate, regular rhythm, normal heart sounds and intact distal pulses.   Respiratory: Effort normal and breath sounds normal. No respiratory distress.  He has no wheezes.  GI: Soft. There is no tenderness. There is no guarding.  Musculoskeletal:       Left hip: He exhibits decreased range of motion, decreased strength, tenderness and bony tenderness. He exhibits no swelling, no deformity and no laceration.  Lymphadenopathy:    He has no cervical adenopathy.  Neurological: He is alert and oriented to person, place, and time.  Skin: Skin is warm and dry.  Psychiatric: He has a normal mood and affect.    Labs:  Estimated body mass index is 25.21 kg/(m^2) as calculated from the following:   Height as of 06/19/13: 5' 7.75" (1.721 m).   Weight as of 06/19/13: 74.662 kg (164 lb 9.6 oz).   Imaging Review Plain radiographs demonstrate severe degenerative joint disease of the left hip(s). The bone quality appears to be good for age and reported activity level.  Assessment/Plan:  End stage arthritis, left hip(s)  The patient history, physical examination, clinical judgement of the provider and imaging studies are consistent with end stage degenerative joint disease of the left hip(s) and total hip arthroplasty is deemed medically necessary. The treatment options including medical management, injection therapy, arthroscopy and arthroplasty were discussed at length. The risks and benefits of total hip arthroplasty were presented and reviewed. The risks due to aseptic loosening, infection, stiffness, dislocation/subluxation,  thromboembolic complications and other imponderables were discussed.  The patient acknowledged the explanation, agreed to proceed with the plan and consent was signed. Patient is being admitted for inpatient treatment for surgery, pain control, PT, OT, prophylactic antibiotics, VTE prophylaxis, progressive ambulation and ADL's and discharge planning.The patient is planning to be discharged home with home health services.     [place] Pugh Sheera Illingworth   PA-C  09/29/2013, 1:10 PM

## 2013-10-03 ENCOUNTER — Encounter (HOSPITAL_COMMUNITY): Payer: Medicare Other | Admitting: Anesthesiology

## 2013-10-03 ENCOUNTER — Encounter (HOSPITAL_COMMUNITY): Payer: Self-pay | Admitting: *Deleted

## 2013-10-03 ENCOUNTER — Inpatient Hospital Stay (HOSPITAL_COMMUNITY): Payer: Medicare Other

## 2013-10-03 ENCOUNTER — Encounter (HOSPITAL_COMMUNITY)
Admission: RE | Disposition: A | Discharge status: Home Health | Payer: Self-pay | Source: Ambulatory Visit | Attending: Orthopedic Surgery

## 2013-10-03 ENCOUNTER — Inpatient Hospital Stay (HOSPITAL_COMMUNITY)
Admission: RE | Admit: 2013-10-03 | Discharge: 2013-10-04 | DRG: 470 | Disposition: A | Discharge status: Home Health | Payer: Medicare Other | Source: Ambulatory Visit | Attending: Orthopedic Surgery | Admitting: Orthopedic Surgery

## 2013-10-03 ENCOUNTER — Inpatient Hospital Stay (HOSPITAL_COMMUNITY): Payer: Medicare Other | Admitting: Anesthesiology

## 2013-10-03 DIAGNOSIS — M1610 Unilateral primary osteoarthritis, unspecified hip: Principal | ICD-10-CM | POA: Diagnosis present

## 2013-10-03 DIAGNOSIS — Z01812 Encounter for preprocedural laboratory examination: Secondary | ICD-10-CM

## 2013-10-03 DIAGNOSIS — D500 Iron deficiency anemia secondary to blood loss (chronic): Secondary | ICD-10-CM | POA: Diagnosis not present

## 2013-10-03 DIAGNOSIS — Z87891 Personal history of nicotine dependence: Secondary | ICD-10-CM

## 2013-10-03 DIAGNOSIS — Z6825 Body mass index (BMI) 25.0-25.9, adult: Secondary | ICD-10-CM

## 2013-10-03 DIAGNOSIS — K219 Gastro-esophageal reflux disease without esophagitis: Secondary | ICD-10-CM | POA: Diagnosis present

## 2013-10-03 DIAGNOSIS — Z860100 Personal history of colon polyps, unspecified: Secondary | ICD-10-CM

## 2013-10-03 DIAGNOSIS — Z833 Family history of diabetes mellitus: Secondary | ICD-10-CM

## 2013-10-03 DIAGNOSIS — I10 Essential (primary) hypertension: Secondary | ICD-10-CM | POA: Diagnosis present

## 2013-10-03 DIAGNOSIS — D62 Acute posthemorrhagic anemia: Secondary | ICD-10-CM | POA: Diagnosis not present

## 2013-10-03 DIAGNOSIS — Z8249 Family history of ischemic heart disease and other diseases of the circulatory system: Secondary | ICD-10-CM

## 2013-10-03 DIAGNOSIS — M169 Osteoarthritis of hip, unspecified: Secondary | ICD-10-CM

## 2013-10-03 DIAGNOSIS — Z96649 Presence of unspecified artificial hip joint: Secondary | ICD-10-CM

## 2013-10-03 HISTORY — PX: TOTAL HIP ARTHROPLASTY: SHX124

## 2013-10-03 LAB — ABO/RH: ABO/RH(D): A NEG

## 2013-10-03 LAB — TYPE AND SCREEN
ABO/RH(D): A NEG
ANTIBODY SCREEN: NEGATIVE

## 2013-10-03 SURGERY — ARTHROPLASTY, HIP, TOTAL, ANTERIOR APPROACH
Anesthesia: Spinal | Site: Hip | Laterality: Left

## 2013-10-03 MED ORDER — PHENOL 1.4 % MT LIQD: 1.0000 | OROMUCOSAL | Status: DC | PRN

## 2013-10-03 MED ORDER — ONDANSETRON HCL 4 MG/2ML IJ SOLN
INTRAMUSCULAR | Status: DC | PRN
  Administered 2013-10-03: 4 mg via INTRAVENOUS

## 2013-10-03 MED ORDER — MEPERIDINE HCL 50 MG/ML IJ SOLN: 6.2500 mg | INTRAMUSCULAR | Status: DC | PRN

## 2013-10-03 MED ORDER — POLYETHYLENE GLYCOL 3350 17 G PO PACK
17.0000 g | PACK | Freq: Two times a day (BID) | ORAL | Status: DC
  Administered 2013-10-03: 17 g via ORAL

## 2013-10-03 MED ORDER — EPHEDRINE SULFATE 50 MG/ML IJ SOLN: INTRAMUSCULAR | Status: DC | PRN

## 2013-10-03 MED ORDER — EPHEDRINE SULFATE 50 MG/ML IJ SOLN
INTRAMUSCULAR | Status: AC
  Filled 2013-10-03: qty 1

## 2013-10-03 MED ORDER — OXYCODONE HCL 5 MG PO TABS
5.0000 mg | ORAL_TABLET | ORAL | Status: DC
  Administered 2013-10-03 (×2): 10 mg via ORAL
  Administered 2013-10-04: 5 mg via ORAL
  Administered 2013-10-04 (×2): 15 mg via ORAL
  Administered 2013-10-04: 10 mg via ORAL
  Filled 2013-10-03 (×2): qty 2
  Filled 2013-10-03: qty 1
  Filled 2013-10-03 (×2): qty 2
  Filled 2013-10-03: qty 3
  Filled 2013-10-03: qty 1

## 2013-10-03 MED ORDER — HYDROMORPHONE HCL PF 1 MG/ML IJ SOLN
INTRAMUSCULAR | Status: AC
  Administered 2013-10-03: 1 mg via INTRAVENOUS
  Filled 2013-10-03: qty 1

## 2013-10-03 MED ORDER — HYDROMORPHONE HCL PF 1 MG/ML IJ SOLN
INTRAMUSCULAR | Status: AC
  Filled 2013-10-03: qty 1

## 2013-10-03 MED ORDER — AMLODIPINE BESYLATE 5 MG PO TABS
5.0000 mg | ORAL_TABLET | Freq: Every morning | ORAL | Status: DC
  Administered 2013-10-04: 5 mg via ORAL
  Filled 2013-10-03: qty 1

## 2013-10-03 MED ORDER — PROPOFOL 10 MG/ML IV BOLUS
INTRAVENOUS | Status: AC
  Filled 2013-10-03: qty 20

## 2013-10-03 MED ORDER — DOCUSATE SODIUM 100 MG PO CAPS
100.0000 mg | ORAL_CAPSULE | Freq: Two times a day (BID) | ORAL | Status: DC
  Administered 2013-10-03 – 2013-10-04 (×2): 100 mg via ORAL

## 2013-10-03 MED ORDER — FENTANYL CITRATE 0.05 MG/ML IJ SOLN
INTRAMUSCULAR | Status: AC
  Filled 2013-10-03: qty 2

## 2013-10-03 MED ORDER — SODIUM CHLORIDE 0.9 % IJ SOLN
INTRAMUSCULAR | Status: AC
  Filled 2013-10-03: qty 10

## 2013-10-03 MED ORDER — RIVAROXABAN 10 MG PO TABS
10.0000 mg | ORAL_TABLET | ORAL | Status: DC
  Administered 2013-10-04: 10 mg via ORAL
  Filled 2013-10-03 (×2): qty 1

## 2013-10-03 MED ORDER — BISACODYL 10 MG RE SUPP: 10.0000 mg | Freq: Every day | RECTAL | Status: DC | PRN

## 2013-10-03 MED ORDER — DIPHENHYDRAMINE HCL 25 MG PO CAPS: 25.0000 mg | ORAL_CAPSULE | Freq: Four times a day (QID) | ORAL | Status: DC | PRN

## 2013-10-03 MED ORDER — PROPOFOL 10 MG/ML IV BOLUS
INTRAVENOUS | Status: DC | PRN
  Administered 2013-10-03: 20 mg via INTRAVENOUS

## 2013-10-03 MED ORDER — METOCLOPRAMIDE HCL 5 MG/ML IJ SOLN: 5.0000 mg | Freq: Three times a day (TID) | INTRAMUSCULAR | Status: DC | PRN

## 2013-10-03 MED ORDER — CELECOXIB 200 MG PO CAPS
200.0000 mg | ORAL_CAPSULE | Freq: Two times a day (BID) | ORAL | Status: DC
  Administered 2013-10-03 – 2013-10-04 (×2): 200 mg via ORAL
  Filled 2013-10-03 (×3): qty 1

## 2013-10-03 MED ORDER — CEFAZOLIN SODIUM-DEXTROSE 2-3 GM-% IV SOLR
INTRAVENOUS | Status: AC
  Filled 2013-10-03: qty 50

## 2013-10-03 MED ORDER — HYDROMORPHONE HCL PF 1 MG/ML IJ SOLN
0.2500 mg | INTRAMUSCULAR | Status: DC | PRN
  Administered 2013-10-03: 0.5 mg via INTRAVENOUS

## 2013-10-03 MED ORDER — BUPIVACAINE HCL (PF) 0.5 % IJ SOLN
INTRAMUSCULAR | Status: DC | PRN
  Administered 2013-10-03: 15 mg

## 2013-10-03 MED ORDER — ALUM & MAG HYDROXIDE-SIMETH 200-200-20 MG/5ML PO SUSP: 30.0000 mL | ORAL | Status: DC | PRN

## 2013-10-03 MED ORDER — METOCLOPRAMIDE HCL 5 MG PO TABS
5.0000 mg | ORAL_TABLET | Freq: Three times a day (TID) | ORAL | Status: DC | PRN
  Filled 2013-10-03: qty 2

## 2013-10-03 MED ORDER — ONDANSETRON HCL 4 MG/2ML IJ SOLN: 4.0000 mg | Freq: Four times a day (QID) | INTRAMUSCULAR | Status: DC | PRN

## 2013-10-03 MED ORDER — MAGNESIUM CITRATE PO SOLN: 1.0000 | Freq: Once | ORAL | Status: AC | PRN

## 2013-10-03 MED ORDER — LIDOCAINE HCL (CARDIAC) 20 MG/ML IV SOLN
INTRAVENOUS | Status: AC
  Filled 2013-10-03: qty 5

## 2013-10-03 MED ORDER — HYDROCHLOROTHIAZIDE 25 MG PO TABS
25.0000 mg | ORAL_TABLET | Freq: Every morning | ORAL | Status: DC
  Administered 2013-10-04: 25 mg via ORAL
  Filled 2013-10-03: qty 1

## 2013-10-03 MED ORDER — EPHEDRINE SULFATE 50 MG/ML IJ SOLN
INTRAMUSCULAR | Status: DC | PRN
  Administered 2013-10-03 (×3): 5 mg via INTRAVENOUS
  Administered 2013-10-03 (×2): 10 mg via INTRAVENOUS

## 2013-10-03 MED ORDER — TRANEXAMIC ACID 100 MG/ML IV SOLN
1000.0000 mg | Freq: Once | INTRAVENOUS | Status: AC
  Administered 2013-10-03: 1000 mg via INTRAVENOUS
  Filled 2013-10-03: qty 10

## 2013-10-03 MED ORDER — HYDROMORPHONE HCL PF 1 MG/ML IJ SOLN
0.2500 mg | INTRAMUSCULAR | Status: DC | PRN
  Administered 2013-10-03 (×4): 0.5 mg via INTRAVENOUS

## 2013-10-03 MED ORDER — DEXAMETHASONE SODIUM PHOSPHATE 10 MG/ML IJ SOLN
10.0000 mg | Freq: Once | INTRAMUSCULAR | Status: DC
  Filled 2013-10-03: qty 1

## 2013-10-03 MED ORDER — FERROUS SULFATE 325 (65 FE) MG PO TABS
325.0000 mg | ORAL_TABLET | Freq: Three times a day (TID) | ORAL | Status: DC
  Filled 2013-10-03 (×4): qty 1

## 2013-10-03 MED ORDER — GLYCOPYRROLATE 0.2 MG/ML IJ SOLN
INTRAMUSCULAR | Status: AC
  Filled 2013-10-03: qty 1

## 2013-10-03 MED ORDER — LIDOCAINE HCL (CARDIAC) 20 MG/ML IV SOLN
INTRAVENOUS | Status: DC | PRN
  Administered 2013-10-03: 50 mg via INTRAVENOUS

## 2013-10-03 MED ORDER — TACROLIMUS 0.1 % EX OINT: 1.0000 "application " | TOPICAL_OINTMENT | Freq: Two times a day (BID) | CUTANEOUS | Status: DC

## 2013-10-03 MED ORDER — CEFAZOLIN SODIUM-DEXTROSE 2-3 GM-% IV SOLR
2.0000 g | Freq: Four times a day (QID) | INTRAVENOUS | Status: AC
  Administered 2013-10-03 (×2): 2 g via INTRAVENOUS
  Filled 2013-10-03 (×2): qty 50

## 2013-10-03 MED ORDER — SODIUM CHLORIDE 0.9 % IV SOLN
100.0000 mL/h | INTRAVENOUS | Status: DC
  Administered 2013-10-03: 100 mL/h via INTRAVENOUS
  Filled 2013-10-03 (×7): qty 1000

## 2013-10-03 MED ORDER — LACTATED RINGERS IV SOLN
INTRAVENOUS | Status: DC | PRN
  Administered 2013-10-03 (×3): via INTRAVENOUS

## 2013-10-03 MED ORDER — FENTANYL CITRATE 0.05 MG/ML IJ SOLN
INTRAMUSCULAR | Status: DC | PRN
  Administered 2013-10-03 (×2): 25 ug via INTRAVENOUS

## 2013-10-03 MED ORDER — GLYCOPYRROLATE 0.2 MG/ML IJ SOLN
INTRAMUSCULAR | Status: DC | PRN
  Administered 2013-10-03: 0.2 mg via INTRAVENOUS

## 2013-10-03 MED ORDER — PROMETHAZINE HCL 25 MG/ML IJ SOLN: 6.2500 mg | INTRAMUSCULAR | Status: DC | PRN

## 2013-10-03 MED ORDER — OXYCODONE HCL 5 MG PO TABS: 5.0000 mg | ORAL_TABLET | Freq: Once | ORAL | Status: DC | PRN

## 2013-10-03 MED ORDER — PROPOFOL INFUSION 10 MG/ML OPTIME
INTRAVENOUS | Status: DC | PRN
  Administered 2013-10-03: 75 ug/kg/min via INTRAVENOUS

## 2013-10-03 MED ORDER — LACTATED RINGERS IV SOLN
INTRAVENOUS | Status: DC
  Administered 2013-10-03: 1000 mL via INTRAVENOUS

## 2013-10-03 MED ORDER — MENTHOL 3 MG MT LOZG: 1.0000 | LOZENGE | OROMUCOSAL | Status: DC | PRN

## 2013-10-03 MED ORDER — METHOCARBAMOL 1000 MG/10ML IJ SOLN
500.0000 mg | Freq: Four times a day (QID) | INTRAVENOUS | Status: DC | PRN
  Administered 2013-10-03: 500 mg via INTRAVENOUS
  Filled 2013-10-03: qty 5

## 2013-10-03 MED ORDER — HYDROMORPHONE HCL PF 1 MG/ML IJ SOLN
0.5000 mg | INTRAMUSCULAR | Status: DC | PRN
  Administered 2013-10-03 – 2013-10-04 (×3): 1 mg via INTRAVENOUS
  Filled 2013-10-03 (×3): qty 1

## 2013-10-03 MED ORDER — DEXAMETHASONE SODIUM PHOSPHATE 10 MG/ML IJ SOLN: 10.0000 mg | Freq: Once | INTRAMUSCULAR | Status: DC

## 2013-10-03 MED ORDER — OXYCODONE HCL 5 MG/5ML PO SOLN
5.0000 mg | Freq: Once | ORAL | Status: DC | PRN
  Filled 2013-10-03: qty 5

## 2013-10-03 MED ORDER — MIDAZOLAM HCL 2 MG/2ML IJ SOLN
INTRAMUSCULAR | Status: AC
  Filled 2013-10-03: qty 2

## 2013-10-03 MED ORDER — METHOCARBAMOL 500 MG PO TABS
500.0000 mg | ORAL_TABLET | Freq: Four times a day (QID) | ORAL | Status: DC | PRN
  Administered 2013-10-04 (×2): 500 mg via ORAL
  Filled 2013-10-03 (×2): qty 1

## 2013-10-03 MED ORDER — CEFAZOLIN SODIUM-DEXTROSE 2-3 GM-% IV SOLR
2.0000 g | INTRAVENOUS | Status: AC
  Administered 2013-10-03: 2 g via INTRAVENOUS

## 2013-10-03 MED ORDER — METOPROLOL TARTRATE 50 MG PO TABS
50.0000 mg | ORAL_TABLET | Freq: Two times a day (BID) | ORAL | Status: DC
  Administered 2013-10-03 – 2013-10-04 (×2): 50 mg via ORAL
  Filled 2013-10-03 (×4): qty 1

## 2013-10-03 MED ORDER — MIDAZOLAM HCL 5 MG/5ML IJ SOLN
INTRAMUSCULAR | Status: DC | PRN
  Administered 2013-10-03 (×2): 1 mg via INTRAVENOUS

## 2013-10-03 MED ORDER — ONDANSETRON HCL 4 MG PO TABS: 4.0000 mg | ORAL_TABLET | Freq: Four times a day (QID) | ORAL | Status: DC | PRN

## 2013-10-03 MED ORDER — 0.9 % SODIUM CHLORIDE (POUR BTL) OPTIME
TOPICAL | Status: DC | PRN
  Administered 2013-10-03: 1000 mL

## 2013-10-03 SURGICAL SUPPLY — 37 items
ADH SKN CLS APL DERMABOND .7 (GAUZE/BANDAGES/DRESSINGS) ×1
BAG SPEC THK2 15X12 ZIP CLS (MISCELLANEOUS)
BAG ZIPLOCK 12X15 (MISCELLANEOUS) IMPLANT
CAPT HIP PF MOP ×2 IMPLANT
COVER PERINEAL POST (MISCELLANEOUS) ×3 IMPLANT
DERMABOND ADVANCED (GAUZE/BANDAGES/DRESSINGS) ×2
DERMABOND ADVANCED .7 DNX12 (GAUZE/BANDAGES/DRESSINGS) ×1 IMPLANT
DRAPE C-ARM 42X120 X-RAY (DRAPES) ×3 IMPLANT
DRAPE STERI IOBAN 125X83 (DRAPES) ×3 IMPLANT
DRAPE U-SHAPE 47X51 STRL (DRAPES) ×9 IMPLANT
DRSG AQUACEL AG ADV 3.5X10 (GAUZE/BANDAGES/DRESSINGS) ×3 IMPLANT
DURAPREP 26ML APPLICATOR (WOUND CARE) ×3 IMPLANT
ELECT BLADE TIP CTD 4 INCH (ELECTRODE) ×3 IMPLANT
ELECT PENCIL ROCKER SW 15FT (MISCELLANEOUS) IMPLANT
ELECT REM PT RETURN 15FT ADLT (MISCELLANEOUS) IMPLANT
FACESHIELD WRAPAROUND (MASK) ×12 IMPLANT
FACESHIELD WRAPAROUND OR TEAM (MASK) ×4 IMPLANT
GLOVE BIOGEL PI IND STRL 7.5 (GLOVE) ×1 IMPLANT
GLOVE BIOGEL PI IND STRL 8 (GLOVE) ×1 IMPLANT
GLOVE BIOGEL PI INDICATOR 7.5 (GLOVE) ×2
GLOVE BIOGEL PI INDICATOR 8 (GLOVE) ×2
GLOVE ECLIPSE 8.0 STRL XLNG CF (GLOVE) ×3 IMPLANT
GLOVE ORTHO TXT STRL SZ7.5 (GLOVE) ×6 IMPLANT
GOWN SPEC L3 XXLG W/TWL (GOWN DISPOSABLE) ×3 IMPLANT
GOWN STRL REUS W/TWL LRG LVL3 (GOWN DISPOSABLE) ×3 IMPLANT
KIT BASIN OR (CUSTOM PROCEDURE TRAY) ×3 IMPLANT
PACK TOTAL JOINT (CUSTOM PROCEDURE TRAY) ×3 IMPLANT
SAW OSC TIP CART 19.5X105X1.3 (SAW) ×3 IMPLANT
SUT MNCRL AB 4-0 PS2 18 (SUTURE) ×3 IMPLANT
SUT VIC AB 1 CT1 36 (SUTURE) ×9 IMPLANT
SUT VIC AB 2-0 CT1 27 (SUTURE) ×6
SUT VIC AB 2-0 CT1 TAPERPNT 27 (SUTURE) ×2 IMPLANT
SUT VLOC 180 0 24IN GS25 (SUTURE) ×3 IMPLANT
TOWEL OR 17X26 10 PK STRL BLUE (TOWEL DISPOSABLE) ×3 IMPLANT
TOWEL OR NON WOVEN STRL DISP B (DISPOSABLE) IMPLANT
TRAY FOLEY CATH 14FRSI W/METER (CATHETERS) ×1 IMPLANT
TRAY FOLEY METER SIL LF 16FR (CATHETERS) ×2 IMPLANT

## 2013-10-03 NOTE — Anesthesia Preprocedure Evaluation (Signed)
Anesthesia Evaluation  Patient identified by MRN, date of birth, ID band Patient awake    Reviewed: Allergy & Precautions, H&P , NPO status , Patient's Chart, lab work & pertinent test results, reviewed documented beta blocker date and time   Airway Mallampati: II TM Distance: >3 FB Neck ROM: Full    Dental  (+) Poor Dentition, Chipped, Missing, Dental Advisory Given   Pulmonary former smoker,  breath sounds clear to auscultation  Pulmonary exam normal       Cardiovascular hypertension, Pt. on medications and Pt. on home beta blockers Rhythm:Regular Rate:Normal     Neuro/Psych negative neurological ROS  negative psych ROS   GI/Hepatic GERD-  ,(+) Hepatitis -, C  Endo/Other  negative endocrine ROS  Renal/GU negative Renal ROS     Musculoskeletal  (+) Arthritis -, Rheumatoid disorders,    Abdominal   Peds  Hematology negative hematology ROS (+)   Anesthesia Other Findings   Reproductive/Obstetrics                           Anesthesia Physical  Anesthesia Plan  ASA: III  Anesthesia Plan:    Post-op Pain Management:    Induction:   Airway Management Planned:   Additional Equipment:   Intra-op Plan:   Post-operative Plan:   Informed Consent: I have reviewed the patients History and Physical, chart, labs and discussed the procedure including the risks, benefits and alternatives for the proposed anesthesia with the patient or authorized representative who has indicated his/her understanding and acceptance.   Dental advisory given  Plan Discussed with: CRNA  Anesthesia Plan Comments:         Anesthesia Quick Evaluation

## 2013-10-03 NOTE — Interval H&P Note (Signed)
History and Physical Interval Note:  10/03/2013 10:12 AM  Jimmy Castaneda  has presented today for surgery, with the diagnosis of let hip osteoarthritis  The various methods of treatment have been discussed with the patient and family. After consideration of risks, benefits and other options for treatment, the patient has consented to  Procedure(s): LEFT TOTAL HIP ARTHROPLASTY ANTERIOR APPROACH (Left) as a surgical intervention .  The patient's history has been reviewed, patient examined, no change in status, stable for surgery.  I have reviewed the patient's chart and labs.  Questions were answered to the patient's satisfaction.     Mauri Pole

## 2013-10-03 NOTE — Transfer of Care (Signed)
Immediate Anesthesia Transfer of Care Note  Patient: Jimmy Castaneda  Procedure(s) Performed: Procedure(s): LEFT TOTAL HIP ARTHROPLASTY ANTERIOR APPROACH (Left)  Patient Location: PACU  Anesthesia Type:Spinal  Level of Consciousness: awake, alert , oriented and patient cooperative  Airway & Oxygen Therapy: Patient Spontanous Breathing and Patient connected to face mask oxygen  Post-op Assessment: Report given to PACU RN and Post -op Vital signs reviewed and stable  Post vital signs: Reviewed  Complications: No apparent anesthesia complications

## 2013-10-03 NOTE — Progress Notes (Signed)
Utilization review completed.  

## 2013-10-03 NOTE — Anesthesia Procedure Notes (Signed)
Spinal  Patient location during procedure: OR Start time: 10/03/2013 12:20 PM End time: 10/03/2013 12:27 PM Staffing CRNA/Resident: Sherian Maroon A Performed by: resident/CRNA  Preanesthetic Checklist Completed: patient identified, site marked, surgical consent, pre-op evaluation, timeout performed, IV checked, risks and benefits discussed and monitors and equipment checked Spinal Block Patient position: sitting Prep: Betadine Patient monitoring: heart rate, cardiac monitor, continuous pulse ox and blood pressure Approach: midline Location: L3-4 Injection technique: single-shot Needle Needle type: Sprotte  Needle gauge: 24 G Needle length: 5 cm Needle insertion depth: 3 cm Assessment Sensory level: T8

## 2013-10-03 NOTE — Op Note (Signed)
NAME:  Jimmy Castaneda                ACCOUNT NO.: [id number]      MEDICAL RECORD NO.: [id number]      FACILITY:  [facility]      PHYSICIAN:  Mauri Pole  DATE OF BIRTH:  11-19-1945     DATE OF PROCEDURE:  10/03/2013                                 OPERATIVE REPORT         PREOPERATIVE DIAGNOSIS: Left  hip osteoarthritis.      POSTOPERATIVE DIAGNOSIS:  Left hip osteoarthritis.      PROCEDURE:  Left total hip replacement through an anterior approach   utilizing DePuy THR system, component size 25mm pinnacle cup, a size 36+4 neutral   Altrex liner, a size 6 Hi Tri Lock stem with a 36+1.5 Articuleze metal ball.      SURGEON:  Pietro Cassis. Alvan Dame, M.D.      ASSISTANT:  Danae Orleans, PA-C     ANESTHESIA:  Spinal.      SPECIMENS:  None.      COMPLICATIONS:  None.      BLOOD LOSS:  275 cc     DRAINS:  None.      INDICATION OF THE PROCEDURE:  Jimmy Castaneda is a 68 y.o. male who had   presented to office for evaluation of left hip pain.  Radiographs revealed   progressive degenerative changes with bone-on-bone   articulation to the  hip joint.  The patient had painful limited range of   motion significantly affecting their overall quality of life.  The patient was failing to    respond to conservative measures, and at this point was ready   to proceed with more definitive measures.  The patient has noted progressive   degenerative changes in his hip, progressive problems and dysfunction   with regarding the hip prior to surgery.  Consent was obtained for   benefit of pain relief.  Specific risk of infection, DVT, component   failure, dislocation, need for revision surgery, as well discussion of   the anterior versus posterior approach were reviewed.  Consent was   obtained for benefit of anterior pain relief through an anterior   approach.      PROCEDURE IN DETAIL:  The patient was brought to operative theater.   Once adequate anesthesia,  preoperative antibiotics, 2gm Ancef administered.   The patient was positioned supine on the OSI Hanna table.  Once adequate   padding of boney process was carried out, we had predraped out the hip, and  used fluoroscopy to confirm orientation of the pelvis and position.      The left hip was then prepped and draped from proximal iliac crest to   mid thigh with shower curtain technique.      Time-out was performed identifying the patient, planned procedure, and   extremity.     An incision was then made 2 cm distal and lateral to the   anterior superior iliac spine extending over the orientation of the   tensor fascia lata muscle and sharp dissection was carried down to the   fascia of the muscle and protractor placed in the soft tissues.      The fascia was then incised.  The muscle belly was identified and swept  laterally and retractor placed along the superior neck.  Following   cauterization of the circumflex vessels and removing some pericapsular   fat, a second cobra retractor was placed on the inferior neck.  A third   retractor was placed on the anterior acetabulum after elevating the   anterior rectus.  A L-capsulotomy was along the line of the   superior neck to the trochanteric fossa, then extended proximally and   distally.  Tag sutures were placed and the retractors were then placed   intracapsular.  We then identified the trochanteric fossa and   orientation of my neck cut, confirmed this radiographically   and then made a neck osteotomy with the femur on traction.  The femoral   head was removed without difficulty or complication.  Traction was let   off and retractors were placed posterior and anterior around the   acetabulum.      The labrum and foveal tissue were debrided.  I began reaming with a 26mm   reamer and reamed up to 16mm reamer with good bony bed preparation and a 52   cup was chosen.  The final 10mm Pinnacle cup was then impacted under fluoroscopy  to  confirm the depth of penetration and orientation with respect to   abduction.  A screw was placed followed by the hole eliminator.  The final   36+4 neutral Altrex liner was impacted with good visualized rim fit.  The cup was positioned anatomically within the acetabular portion of the pelvis.      At this point, the femur was rolled at 80 degrees.  Further capsule was   released off the inferior aspect of the femoral neck.  I then   released the superior capsule proximally.  The hook was placed laterally   along the femur and elevated manually and held in position with the bed   hook.  The leg was then extended and adducted with the leg rolled to 100   degrees of external rotation.  Once the proximal femur was fully   exposed, I used a box osteotome to set orientation.  I then began   broaching with the starting chili pepper broach and passed this by hand and then broached up to 6.  With the 6 broach in place I chose a high offset neck and did a trial reduction.  The offset was appropriate, leg lengths   appeared to be equal, confirmed radiographically.   Given these findings, I went ahead and dislocated the hip, repositioned all   retractors and positioned the right hip in the extended and abducted position.  The final 6 Hi Tri Lock stem was   chosen and it was impacted down to the level of neck cut.  Based on this   and the trial reduction, a 36+1.5 Articuleze metal ball was chosen and   impacted onto a clean and dry trunnion, and the hip was reduced.  The   hip had been irrigated throughout the case again at this point.  I did   reapproximate the superior capsular leaflet to the anterior leaflet   using #1 Vicryl.  The fascia of the   tensor fascia lata muscle was then reapproximated using #1 Vicryl and #0 V-lock sutures.  The   remaining wound was closed with 2-0 Vicryl and running 4-0 Monocryl.   The hip was cleaned, dried, and dressed sterilely using Dermabond and   Aquacel dressing.   She was then brought   to recovery room  in stable condition tolerating the procedure well.    Danae Orleans, PA-C was present for the entirety of the case involved from   preoperative positioning, perioperative retractor management, general   facilitation of the case, as well as primary wound closure as assistant.            Pietro Cassis Alvan Dame, M.D.        10/03/2013 1:48 PM

## 2013-10-03 NOTE — Anesthesia Postprocedure Evaluation (Signed)
Anesthesia Post Note  Patient: Jimmy Castaneda  Procedure(s) Performed: Procedure(s) (LRB): LEFT TOTAL HIP ARTHROPLASTY ANTERIOR APPROACH (Left)  Anesthesia type: Spinal  Patient location: PACU  Post pain: Pain level controlled  Post assessment: Post-op Vital signs reviewed  Last Vitals: BP 162/84  Pulse 64  Temp(Src) 36.3 C (Oral)  Resp 16  Ht 5' 7.5" (1.715 m)  Wt 153 lb (69.4 kg)  BMI 23.60 kg/m2  SpO2 100%  Post vital signs: Reviewed  Level of consciousness: sedated  Complications: No apparent anesthesia complications

## 2013-10-04 ENCOUNTER — Encounter (HOSPITAL_COMMUNITY): Payer: Self-pay | Admitting: Orthopedic Surgery

## 2013-10-04 DIAGNOSIS — D500 Iron deficiency anemia secondary to blood loss (chronic): Secondary | ICD-10-CM | POA: Diagnosis not present

## 2013-10-04 LAB — CBC
HCT: 33.3 % — ABNORMAL LOW (ref 39.0–52.0)
Hemoglobin: 11.8 g/dL — ABNORMAL LOW (ref 13.0–17.0)
MCH: 34.6 pg — ABNORMAL HIGH (ref 26.0–34.0)
MCHC: 35.4 g/dL (ref 30.0–36.0)
MCV: 97.7 fL (ref 78.0–100.0)
PLATELETS: 184 10*3/uL (ref 150–400)
RBC: 3.41 MIL/uL — AB (ref 4.22–5.81)
RDW: 11.9 % (ref 11.5–15.5)
WBC: 9.9 10*3/uL (ref 4.0–10.5)

## 2013-10-04 LAB — BASIC METABOLIC PANEL
BUN: 9 mg/dL (ref 6–23)
CHLORIDE: 95 meq/L — AB (ref 96–112)
CO2: 28 meq/L (ref 19–32)
CREATININE: 0.71 mg/dL (ref 0.50–1.35)
Calcium: 9 mg/dL (ref 8.4–10.5)
GFR calc non Af Amer: >90 mL/min (ref >90)
Glucose, Bld: 142 mg/dL — ABNORMAL HIGH (ref 70–99)
POTASSIUM: 4.5 meq/L (ref 3.7–5.3)
SODIUM: 133 meq/L — AB (ref 137–147)

## 2013-10-04 MED ORDER — RIVAROXABAN 10 MG PO TABS: 10.0000 mg | ORAL_TABLET | ORAL | Status: DC

## 2013-10-04 MED ORDER — METHOCARBAMOL 500 MG PO TABS: 500.0000 mg | ORAL_TABLET | Freq: Four times a day (QID) | ORAL | Status: DC | PRN

## 2013-10-04 MED ORDER — POLYETHYLENE GLYCOL 3350 17 G PO PACK: 17.0000 g | PACK | Freq: Two times a day (BID) | ORAL | Status: DC

## 2013-10-04 MED ORDER — OXYCODONE HCL 5 MG PO TABS: 5.0000 mg | ORAL_TABLET | ORAL | Status: DC | PRN

## 2013-10-04 MED ORDER — FERROUS SULFATE 325 (65 FE) MG PO TABS: 325.0000 mg | ORAL_TABLET | Freq: Three times a day (TID) | ORAL | Status: DC

## 2013-10-04 MED ORDER — DSS 100 MG PO CAPS: 100.0000 mg | ORAL_CAPSULE | Freq: Two times a day (BID) | ORAL | Status: DC

## 2013-10-04 NOTE — Progress Notes (Signed)
Physical Therapy Treatment Patient Details Name: Jimmy Castaneda MRN: [id number] DOB: Oct 25, 1945 Today's Date: 10/04/2013    History of Present Illness      PT Comments    Progressing well.   Reviewed stairs and car transfers.  Follow Up Recommendations  Home health PT     Equipment Recommendations  Rolling walker with 5" wheels    Recommendations for Other Services OT consult     Precautions / Restrictions Precautions Precautions: Fall Restrictions Weight Bearing Restrictions: No Other Position/Activity Restrictions: WBAT    Mobility  Bed Mobility Overal bed mobility: Modified Independent Bed Mobility: Supine to Sit;Sit to Supine     Supine to sit: Modified independent (Device/Increase time) Sit to supine: Modified independent (Device/Increase time)   General bed mobility comments: cues for sequencing and use of R LE to self assist  Transfers Overall transfer level: Needs assistance Equipment used: Rolling walker (2 wheeled) Transfers: Sit to/from Stand Sit to Stand: Modified independent (Device/Increase time)         General transfer comment: cues for use of UEs to self assist  Ambulation/Gait Ambulation/Gait assistance: Supervision Ambulation Distance (Feet): 150 Feet Assistive device: Rolling walker (2 wheeled) Gait Pattern/deviations: Step-through pattern     General Gait Details: min cues for posture and position from RW   Stairs Stairs: Yes Stairs assistance: Min guard Stair Management: No rails;Step to pattern;Forwards;With walker Number of Stairs: 1 General stair comments: cues for sequence and foot/RW placement  Wheelchair Mobility    Modified Rankin (Stroke Patients Only)       Balance                                    Cognition Arousal/Alertness: Awake/alert Behavior During Therapy: WFL for tasks assessed/performed Overall Cognitive Status: Within Functional Limits for tasks assessed                      Exercises Total Joint Exercises Ankle Circles/Pumps: AROM;Both;15 reps;Supine Quad Sets: AROM;Both;10 reps;Supine Heel Slides: AAROM;15 reps;Supine;Left Hip ABduction/ADduction: AAROM;Left;15 reps;Supine    General Comments        Pertinent Vitals/Pain Min c/o pain    Home Living Family/patient expects to be discharged to:: Private residence Living Arrangements: Alone Available Help at Discharge: Friend(s);Available PRN/intermittently Type of Home: House Home Access: Stairs to enter   Home Layout: One level Home Equipment: Environmental consultant - 4 wheels Additional Comments: Pt states can have 24/7 initially    Prior Function Level of Independence: Independent          PT Goals (current goals can now be found in the care plan section) Acute Rehab PT Goals Patient Stated Goal: Resume previous lifestyle with decreased pain PT Goal Formulation: With patient Time For Goal Achievement: 10/11/13 Potential to Achieve Goals: Good Progress towards PT goals: Progressing toward goals    Frequency  7X/week    PT Plan Current plan remains appropriate    Co-evaluation             End of Session Equipment Utilized During Treatment: Gait belt Activity Tolerance: Patient tolerated treatment well Patient left: in chair;with call bell/phone within reach     Time: 1141-1157 PT Time Calculation (min): 16 min  Charges:  $Gait Training: 8-22 mins $Therapeutic Exercise: 8-22 mins                    G Codes:  [name] 10/04/2013, 12:20 PM

## 2013-10-04 NOTE — Discharge Instructions (Signed)
Information on my medicine - XARELTO (Rivaroxaban)  This medication education was reviewed with me or my healthcare representative as part of my discharge preparation.  The pharmacist that spoke with me during my hospital stay was:  Joycelyn Rua, [facility]  Why was Xarelto prescribed for you? Xarelto was prescribed for you to reduce the risk of blood clots forming after orthopedic surgery. The medical term for these abnormal blood clots is venous thromboembolism (VTE).  What do you need to know about xarelto ? Take your Xarelto ONCE DAILY at the same time every day. You may take it either with or without food.  If you have difficulty swallowing the tablet whole, you may crush it and mix in applesauce just prior to taking your dose.  Take Xarelto exactly as prescribed by your doctor and DO NOT stop taking Xarelto without talking to the doctor who prescribed the medication.  Stopping without other VTE prevention medication to take the place of Xarelto may increase your risk of developing a clot.  After discharge, you should have regular check-up appointments with your healthcare provider that is prescribing your Xarelto.    What do you do if you miss a dose? If you miss a dose, take it as soon as you remember on the same day then continue your regularly scheduled once daily regimen the next day. Do not take two doses of Xarelto on the same day.   Important Safety Information A possible side effect of Xarelto is bleeding. You should call your healthcare provider right away if you experience any of the following:   Bleeding from an injury or your nose that does not stop.   Unusual colored urine (red or dark brown) or unusual colored stools (red or black).   Unusual bruising for unknown reasons.   A serious fall or if you hit your head (even if there is no bleeding).  Some medicines may interact with Xarelto and might increase your risk of bleeding while on Xarelto. To help avoid  this, consult your healthcare provider or pharmacist prior to using any new prescription or non-prescription medications, including herbals, vitamins, non-steroidal anti-inflammatory drugs (NSAIDs) and supplements.  This website has more information on Xarelto: [email or web address]

## 2013-10-04 NOTE — Progress Notes (Signed)
[facility]  [facility] is providing the following services: rw and commode  If patient discharges after hours, please call [phone].   Linward Headland 10/04/2013, 9:43 AM

## 2013-10-04 NOTE — Evaluation (Signed)
Physical Therapy Evaluation Patient Details Name: Jimmy Castaneda MRN: [id number] DOB: Aug 06, 1945 Today's Date: 10/04/2013   History of Present Illness     Clinical Impression  Pt s/p L THR presents with decreased L LE strength/ROM and post op pain limiting functional mobility.  Pt should progress to d/c home with follow up HHPT and assist of family/friends.    Follow Up Recommendations Home health PT    Equipment Recommendations  Rolling walker with 5" wheels    Recommendations for Other Services OT consult     Precautions / Restrictions Precautions Precautions: Fall Restrictions Weight Bearing Restrictions: No Other Position/Activity Restrictions: WBAT      Mobility  Bed Mobility Overal bed mobility: Needs Assistance Bed Mobility: Supine to Sit     Supine to sit: Min guard     General bed mobility comments: cues for sequencing and use of R LE to self assist  Transfers Overall transfer level: Needs assistance Equipment used: Rolling walker (2 wheeled) Transfers: Sit to/from Stand Sit to Stand: Min guard         General transfer comment: cues for LE management and use of UEs to self assist  Ambulation/Gait Ambulation/Gait assistance: Min guard Ambulation Distance (Feet): 140 Feet Assistive device: Rolling walker (2 wheeled) Gait Pattern/deviations: Step-to pattern;Step-through pattern;Decreased step length - right;Decreased step length - left;Shuffle;Trunk flexed     General Gait Details: cues for posture, position from RW and intial sequence  Stairs            Wheelchair Mobility    Modified Rankin (Stroke Patients Only)       Balance                                             Pertinent Vitals/Pain Min c/o pain; premed    Home Living Family/patient expects to be discharged to:: Private residence Living Arrangements: Alone Available Help at Discharge: Friend(s);Available PRN/intermittently Type of Home:  House Home Access: Stairs to enter   [employer] of Steps: 1 Home Layout: One level Home Equipment: Walker - 4 wheels Additional Comments: Pt states can have 24/7 initially    Prior Function Level of Independence: Independent               Hand Dominance        Extremity/Trunk Assessment   Upper Extremity Assessment: Overall WFL for tasks assessed           Lower Extremity Assessment: LLE deficits/detail   LLE Deficits / Details: Hip strength 3-/5 with AAROM at hip to 100 flex and 30 abd  Cervical / Trunk Assessment: Normal  Communication   Communication: No difficulties  Cognition Arousal/Alertness: Awake/alert Behavior During Therapy: WFL for tasks assessed/performed Overall Cognitive Status: Within Functional Limits for tasks assessed                      General Comments      Exercises Total Joint Exercises Ankle Circles/Pumps: AROM;Both;15 reps;Supine Quad Sets: AROM;Both;10 reps;Supine Heel Slides: AAROM;15 reps;Supine;Left Hip ABduction/ADduction: AAROM;Left;15 reps;Supine      Assessment/Plan    PT Assessment Patient needs continued PT services  PT Diagnosis Difficulty walking   PT Problem List Decreased strength;Decreased range of motion;Decreased activity tolerance;Decreased mobility;Decreased knowledge of use of DME;Pain  PT Treatment Interventions DME instruction;Gait training;Stair training;Functional mobility training;Therapeutic activities;Therapeutic exercise;Patient/family education   PT Goals (Current  goals can be found in the Care Plan section) Acute Rehab PT Goals Patient Stated Goal: Resume previous lifestyle with decreased pain PT Goal Formulation: With patient Time For Goal Achievement: 10/11/13 Potential to Achieve Goals: Good    Frequency 7X/week   Barriers to discharge        Co-evaluation               End of Session Equipment Utilized During Treatment: Gait belt Activity Tolerance:  Patient tolerated treatment well Patient left: in chair;with call bell/phone within reach Nurse Communication: Mobility status         Time: 0830-0902 PT Time Calculation (min): 32 min   Charges:   PT Evaluation $Initial PT Evaluation Tier I: 1 Procedure PT Treatments $Gait Training: 8-22 mins $Therapeutic Exercise: 8-22 mins   PT G Codes:          [name] 10/04/2013, 9:45 AM

## 2013-10-04 NOTE — Progress Notes (Signed)
   Subjective: 1 Day Post-Op Procedure(s) (LRB): LEFT TOTAL HIP ARTHROPLASTY ANTERIOR APPROACH (Left)   Patient reports pain as mild, pain controlled. States that the pain he was having prior to surgery is gone, muscle soreness on the anterior aspect of the thigh, but is doing well.  Already happy with the results of surgery. No events throughout the night. Ready to be discharged home if he does well with PT.   Objective:   VITALS:   Filed Vitals:   10/04/13  BP: 149/89  Pulse: 76  Temp: 97.7 F (36.5 C)   Resp: 16    Neurovascular intact Dorsiflexion/Plantar flexion intact Incision: dressing C/D/I No cellulitis present Compartment soft  LABS  Recent Labs  10/04/13 0428  HGB 11.8*  HCT 33.3*  WBC 9.9  PLT 184     Recent Labs  10/04/13 0428  NA 133*  K 4.5  BUN 9  CREATININE 0.71  GLUCOSE 142*     Assessment/Plan: 1 Day Post-Op Procedure(s) (LRB): LEFT TOTAL HIP ARTHROPLASTY ANTERIOR APPROACH (Left) Foley cath d/c'ed Advance diet Up with therapy D/C IV fluids Discharge home with home health [facility]. Follow up with OLIN,Gus Littler D in 2 weeks.  Contact information:  [facility] [address] [place] [phone]    Expected ABLA  Treated with iron and will observe        Jimmy Castaneda   PAC  10/04/2013, 9:06 AM

## 2013-10-04 NOTE — Evaluation (Signed)
Occupational Therapy Evaluation Patient Details Name: ALESSANDER SIKORSKI MRN: [id number] DOB: November 30, 1945 Today's Date: 10/04/2013    History of Present Illness     Clinical Impression   This 68 year old man was admitted for L DA THA.  All education was completed.  No further OT is needed at this time.      Follow Up Recommendations  No OT follow up    Equipment Recommendations  3 in 1 bedside comode    Recommendations for Other Services       Precautions / Restrictions Precautions Precautions: Fall Restrictions Weight Bearing Restrictions: No Other Position/Activity Restrictions: WBAT      Mobility Bed Mobility             Transfers Overall transfer level: Needs assistance Equipment used: Rolling walker (2 wheeled) Transfers: Sit to/from Stand Sit to Stand: Supervision         General transfer comment: cues for hand placement    Balance                                            ADL Overall ADL's : Needs assistance/impaired     Grooming: Wash/dry hands;Supervision/safety;Standing       Lower Body Bathing: Supervison/ safety;Sit to/from stand       Lower Body Dressing: Sit to/from stand;With adaptive equipment;Supervision/safety   Toilet Transfer: Supervision;BSC;Ambulation   Toileting- Water quality scientist and Hygiene: Supervision/safety;Sit to/from stand   Tub/ Shower Transfer: Min guard;Ambulation;Walk-in shower   Functional mobility during ADLs: Min guard;Rolling walker General ADL Comments: practiced with reacher, which he has and educated on and practiced with sock aide.  Pt will have initial 24/7.  A 3:1 was delivered to room; educated on using over commode and having someone put it into the shower as a shower seat.       Vision                     Perception     Praxis      Pertinent Vitals/Pain L hip sore; repositioned with ice     Hand Dominance     Extremity/Trunk Assessment Upper  Extremity Assessment Upper Extremity Assessment: Overall WFL for tasks assessed           Communication Communication Communication: No difficulties   Cognition Arousal/Alertness: Awake/alert Behavior During Therapy: WFL for tasks assessed/performed Overall Cognitive Status: Within Functional Limits for tasks assessed                     General Comments       Exercises       Shoulder Instructions      Home Living Family/patient expects to be discharged to:: Private residence Living Arrangements: Alone Available Help at Discharge: Friend(s);Available PRN/intermittently Type of Home: House Home Access: Stairs to enter [employer] of Steps: 1   Home Layout: One level     Bathroom Shower/Tub: Occupational psychologist: Standard     Home Equipment: Environmental consultant - 4 wheels   Additional Comments: Pt states can have 24/7 initially      Prior Functioning/Environment Level of Independence: Independent        Comments: works at a daycare    OT Diagnosis:     OT Problem List:     OT Treatment/Interventions:      OT Goals(Current goals can be  found in the care plan section) Acute Rehab OT Goals Patient Stated Goal: Resume previous lifestyle with decreased pain  OT Frequency:     Barriers to D/C:            Co-evaluation              End of Session    Activity Tolerance: Patient tolerated treatment well Patient left: in chair;with call bell/phone within reach   Time: 1049-1108 OT Time Calculation (min): 19 min Charges:  OT General Charges $OT Visit: 1 Procedure OT Evaluation $Initial OT Evaluation Tier I: 1 Procedure OT Treatments $Self Care/Home Management : 8-22 mins G-Codes:    Lesle Chris 05-Oct-2013, 2:17 PM Lesle Chris, OTR/L [phone] 05-Oct-2013

## 2013-10-09 NOTE — Discharge Summary (Signed)
Physician Discharge Summary  Patient ID: Jimmy Castaneda MRN: [id number] DOB/AGE: 1946-03-14 68 y.o.  Admit date: 10/03/2013 Discharge date: 10/04/2013   Procedures:  Procedure(s) (LRB): LEFT TOTAL HIP ARTHROPLASTY ANTERIOR APPROACH (Left)  Attending Physician:  Dr. Paralee Cancel   Admission Diagnoses:   Left hip OA / pain  Discharge Diagnoses:  Active Problems:   S/P left THA, AA   S/P hip replacement   Expected blood loss anemia  Past Medical History  Diagnosis Date  . Hypertension   . Adenomatous colon polyp   . Vitiligo   . GERD (gastroesophageal reflux disease)     never required medication to treat  . Osteoarthritis     plans left hip replacement   . Rheumatoid arthritis   . Hepatitis C     HPI: Jimmy Castaneda, 68 y.o. male, has a history of pain and functional disability in the left hip(s) due to arthritis and patient has failed non-surgical conservative treatments for greater than 12 weeks to include NSAID's and/or analgesics and activity modification. Onset of symptoms was gradual starting years ago with gradually worsening course since that time.The patient noted no past surgery on the left hip(s). Patient currently rates pain in the left hip at 8 out of 10 with activity. Patient has night pain, worsening of pain with activity and weight bearing, trendelenberg gait, pain that interfers with activities of daily living and pain with passive range of motion. Patient has evidence of periarticular osteophytes and joint space narrowing by imaging studies. This condition presents safety issues increasing the risk of falls. There is no current active infection. Risks, benefits and expectations were discussed with the patient. Risks including but not limited to the risk of anesthesia, blood clots, nerve damage, blood vessel damage, failure of the prosthesis, infection and up to and including death. Patient understand the risks, benefits and expectations and wishes to  proceed with surgery.    PCP: Wenda Low, MD   Discharged Condition: good  Hospital Course:  Patient underwent the above stated procedure on 10/03/2013. Patient tolerated the procedure well and brought to the recovery room in good condition and subsequently to the floor.  POD #1 BP: 149/89 ; Pulse: 76 ; Temp: 97.7 F (36.5 C) ; Resp: 16  Patient reports pain as mild, pain controlled. States that the pain he was having prior to surgery is gone, muscle soreness on the anterior aspect of the thigh, but is doing well. Already happy with the results of surgery. No events throughout the night. Ready to be discharged home.  Neurovascular intact, dorsiflexion/plantar flexion intact, incision: dressing C/D/I, no cellulitis present and compartment soft.   LABS  Basename    HGB  11.8  HCT  33.3    Discharge Exam: General appearance: alert, cooperative and no distress Extremities: Homans sign is negative, no sign of DVT, no edema, redness or tenderness in the calves or thighs and no ulcers, gangrene or trophic changes  Disposition: Home with follow up in 2 weeks   Follow-up Information   Follow up with Mauri Pole, MD. Schedule an appointment as soon as possible for a visit in 2 weeks.   Specialty:  Orthopedic Surgery   Contact information:   [address] [place] [address] [phone]       Discharge Instructions   Call MD / Call 911    Complete by:  As directed   If you experience chest pain or shortness of breath, CALL 911 and be transported to  the hospital emergency room.  If you develope a fever above 101 F, pus (white drainage) or increased drainage or redness at the wound, or calf pain, call your surgeon's office.     Change dressing    Complete by:  As directed   Maintain surgical dressing for 10-14 days, or until follow up in the clinic.     Constipation Prevention    Complete by:  As directed   Drink plenty of fluids.  Prune juice may be helpful.   You may use a stool softener, such as Colace (over the counter) 100 mg twice a day.  Use MiraLax (over the counter) for constipation as needed.     Diet - low sodium heart healthy    Complete by:  As directed      Discharge instructions    Complete by:  As directed   Maintain surgical dressing for 10-14 days, or until follow up in the clinic. Follow up in 2 weeks at [facility]. Call with any questions or concerns.     Increase activity slowly as tolerated    Complete by:  As directed      TED hose    Complete by:  As directed   Use stockings (TED hose) for 2 weeks on both leg(s).  You may remove them at night for sleeping.     Weight bearing as tolerated    Complete by:  As directed              Medication List    STOP taking these medications       traMADol 50 MG tablet  Commonly known as:  ULTRAM      TAKE these medications       amLODipine 5 MG tablet  Commonly known as:  NORVASC  Take 5 mg by mouth every morning.     DSS 100 MG Caps  Take 100 mg by mouth 2 (two) times daily.     ferrous sulfate 325 (65 FE) MG tablet  Take 1 tablet (325 mg total) by mouth 3 (three) times daily after meals.     hydrochlorothiazide 25 MG tablet  Commonly known as:  HYDRODIURIL  Take 25 mg by mouth every morning.     methocarbamol 500 MG tablet  Commonly known as:  ROBAXIN  Take 1 tablet (500 mg total) by mouth every 6 (six) hours as needed for muscle spasms.     metoprolol 100 MG tablet  Commonly known as:  LOPRESSOR  Take 50 mg by mouth 2 (two) times daily. 1/2 tablet twice a day     oxyCODONE 5 MG immediate release tablet  Commonly known as:  Oxy IR/ROXICODONE  Take 1-3 tablets (5-15 mg total) by mouth every 4 (four) hours as needed for severe pain.     polyethylene glycol packet  Commonly known as:  MIRALAX / GLYCOLAX  Take 17 g by mouth 2 (two) times daily.     rivaroxaban 10 MG Tabs tablet  Commonly known as:  XARELTO  Take 1 tablet (10 mg total) by  mouth daily.     tacrolimus 0.1 % ointment  Commonly known as:  PROTOPIC  Apply 1 application topically 2 (two) times daily.         Signed: [place] Pugh. Tyriek Hofman   PA-C  10/09/2013, 4:43 PM

## 2013-10-31 ENCOUNTER — Encounter (HOSPITAL_COMMUNITY): Payer: Self-pay | Admitting: Dentistry

## 2013-11-01 ENCOUNTER — Encounter (HOSPITAL_COMMUNITY): Payer: Self-pay | Admitting: Dentistry

## 2013-11-06 ENCOUNTER — Encounter (HOSPITAL_COMMUNITY): Payer: Self-pay | Admitting: Dentistry

## 2013-11-07 ENCOUNTER — Encounter (HOSPITAL_COMMUNITY): Payer: Self-pay | Admitting: Dentistry

## 2013-11-15 ENCOUNTER — Encounter (HOSPITAL_COMMUNITY): Payer: Self-pay | Admitting: Dentistry

## 2014-06-15 ENCOUNTER — Encounter (HOSPITAL_COMMUNITY): Payer: Self-pay | Admitting: Family Medicine

## 2014-06-15 ENCOUNTER — Emergency Department (HOSPITAL_COMMUNITY)
Admission: EM | Admit: 2014-06-15 | Discharge: 2014-06-15 | Disposition: A | Discharge status: Home / Self Care | Payer: Commercial Managed Care - HMO | Attending: Emergency Medicine | Admitting: Emergency Medicine

## 2014-06-15 ENCOUNTER — Emergency Department (HOSPITAL_COMMUNITY): Payer: Commercial Managed Care - HMO

## 2014-06-15 DIAGNOSIS — Y998 Other external cause status: Secondary | ICD-10-CM | POA: Diagnosis not present

## 2014-06-15 DIAGNOSIS — Z87891 Personal history of nicotine dependence: Secondary | ICD-10-CM | POA: Insufficient documentation

## 2014-06-15 DIAGNOSIS — Z79899 Other long term (current) drug therapy: Secondary | ICD-10-CM | POA: Diagnosis not present

## 2014-06-15 DIAGNOSIS — M25519 Pain in unspecified shoulder: Secondary | ICD-10-CM | POA: Diagnosis not present

## 2014-06-15 DIAGNOSIS — S199XXA Unspecified injury of neck, initial encounter: Secondary | ICD-10-CM | POA: Diagnosis present

## 2014-06-15 DIAGNOSIS — Z7901 Long term (current) use of anticoagulants: Secondary | ICD-10-CM | POA: Insufficient documentation

## 2014-06-15 DIAGNOSIS — Z79891 Long term (current) use of opiate analgesic: Secondary | ICD-10-CM | POA: Insufficient documentation

## 2014-06-15 DIAGNOSIS — S39012A Strain of muscle, fascia and tendon of lower back, initial encounter: Secondary | ICD-10-CM | POA: Diagnosis not present

## 2014-06-15 DIAGNOSIS — Z8719 Personal history of other diseases of the digestive system: Secondary | ICD-10-CM | POA: Insufficient documentation

## 2014-06-15 DIAGNOSIS — Z872 Personal history of diseases of the skin and subcutaneous tissue: Secondary | ICD-10-CM | POA: Diagnosis not present

## 2014-06-15 DIAGNOSIS — I10 Essential (primary) hypertension: Secondary | ICD-10-CM | POA: Diagnosis not present

## 2014-06-15 DIAGNOSIS — Z86010 Personal history of colonic polyps: Secondary | ICD-10-CM | POA: Insufficient documentation

## 2014-06-15 DIAGNOSIS — S134XXA Sprain of ligaments of cervical spine, initial encounter: Secondary | ICD-10-CM | POA: Insufficient documentation

## 2014-06-15 DIAGNOSIS — Y9389 Activity, other specified: Secondary | ICD-10-CM | POA: Diagnosis not present

## 2014-06-15 DIAGNOSIS — Z8619 Personal history of other infectious and parasitic diseases: Secondary | ICD-10-CM | POA: Diagnosis not present

## 2014-06-15 DIAGNOSIS — Y92410 Unspecified street and highway as the place of occurrence of the external cause: Secondary | ICD-10-CM | POA: Insufficient documentation

## 2014-06-15 DIAGNOSIS — S139XXA Sprain of joints and ligaments of unspecified parts of neck, initial encounter: Secondary | ICD-10-CM

## 2014-06-15 MED ORDER — METHOCARBAMOL 500 MG PO TABS: 500.0000 mg | ORAL_TABLET | Freq: Two times a day (BID) | ORAL | Status: DC

## 2014-06-15 MED ORDER — NAPROXEN 500 MG PO TABS: 500.0000 mg | ORAL_TABLET | Freq: Two times a day (BID) | ORAL | Status: DC

## 2014-06-15 MED ORDER — HYDROCODONE-ACETAMINOPHEN 5-325 MG PO TABS: 1.0000 | ORAL_TABLET | Freq: Four times a day (QID) | ORAL | Status: DC | PRN
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
## 2014-11-01 ENCOUNTER — Emergency Department (HOSPITAL_COMMUNITY)
Admission: EM | Admit: 2014-11-01 | Discharge: 2014-11-01 | Disposition: A | Discharge status: Home / Self Care | Payer: Commercial Managed Care - HMO | Attending: Emergency Medicine | Admitting: Emergency Medicine

## 2014-11-01 ENCOUNTER — Encounter (HOSPITAL_COMMUNITY): Payer: Self-pay | Admitting: Emergency Medicine

## 2014-11-01 DIAGNOSIS — Z79899 Other long term (current) drug therapy: Secondary | ICD-10-CM | POA: Diagnosis not present

## 2014-11-01 DIAGNOSIS — S1096XA Insect bite of unspecified part of neck, initial encounter: Secondary | ICD-10-CM | POA: Diagnosis present

## 2014-11-01 DIAGNOSIS — Y929 Unspecified place or not applicable: Secondary | ICD-10-CM | POA: Diagnosis not present

## 2014-11-01 DIAGNOSIS — Z8619 Personal history of other infectious and parasitic diseases: Secondary | ICD-10-CM | POA: Diagnosis not present

## 2014-11-01 DIAGNOSIS — Z87891 Personal history of nicotine dependence: Secondary | ICD-10-CM | POA: Insufficient documentation

## 2014-11-01 DIAGNOSIS — Y939 Activity, unspecified: Secondary | ICD-10-CM | POA: Diagnosis not present

## 2014-11-01 DIAGNOSIS — I10 Essential (primary) hypertension: Secondary | ICD-10-CM | POA: Diagnosis not present

## 2014-11-01 DIAGNOSIS — Z791 Long term (current) use of non-steroidal anti-inflammatories (NSAID): Secondary | ICD-10-CM | POA: Diagnosis not present

## 2014-11-01 DIAGNOSIS — M1990 Unspecified osteoarthritis, unspecified site: Secondary | ICD-10-CM | POA: Insufficient documentation

## 2014-11-01 DIAGNOSIS — Z8719 Personal history of other diseases of the digestive system: Secondary | ICD-10-CM | POA: Insufficient documentation

## 2014-11-01 DIAGNOSIS — Y999 Unspecified external cause status: Secondary | ICD-10-CM | POA: Diagnosis not present

## 2014-11-01 DIAGNOSIS — Z86018 Personal history of other benign neoplasm: Secondary | ICD-10-CM | POA: Diagnosis not present

## 2014-11-01 DIAGNOSIS — M069 Rheumatoid arthritis, unspecified: Secondary | ICD-10-CM | POA: Insufficient documentation

## 2014-11-01 DIAGNOSIS — W57XXXA Bitten or stung by nonvenomous insect and other nonvenomous arthropods, initial encounter: Secondary | ICD-10-CM | POA: Insufficient documentation

## 2014-11-01 MED ORDER — PERMETHRIN 5 % EX CREA: TOPICAL_CREAM | CUTANEOUS | Status: DC

## 2014-11-01 NOTE — Discharge Instructions (Signed)
Take the prescribed medication as directed.  May repeat in 1 week if needed.  Can use benadryl if needed for itching. Follow-up with your primary care physician. Return to the ED for new or worsening symptoms.   Bedbugs Bedbugs are tiny bugs that live in and around beds. During the day, they hide in mattresses and other places near beds. They come out at night and bite people lying in bed. They need blood to live and grow. Bedbugs can be found in beds anywhere. Usually, they are found in places where many people come and go (hotels, shelters, hospitals). It does not matter whether the place is dirty or clean. Getting bitten by bedbugs rarely causes a medical problem. The biggest problem can be getting rid of them. This often takes the work of a Financial risk analyst. CAUSES  Less use of pesticides. Bedbugs were common before the 1950s. Then, strong pesticides such as DDT nearly wiped them out. Today, these pesticides are not used because they harm the environment and can cause health problems.  More travel. Besides mattresses, bedbugs can also live in clothing and luggage. They can come along as people travel from place to place. Bedbugs are more common in certain parts of the world. When people travel to those areas, the bugs can come home with them.  Presence of birds and bats. Bedbugs often infest birds and bats. If you have these animals in or near your home, bedbugs may infest your house, too. SYMPTOMS It does not hurt to be bitten by a bedbug. You will probably not wake up when you are bitten. Bedbugs usually bite areas of the skin that are not covered. Symptoms may show when you wake up, or they may take a day or more to show up. Symptoms may include:  Small red bumps on the skin. These might be lined up in a row or clustered in a group.  A darker red dot in the middle of red bumps.  Blisters on the skin. There may be swelling and very bad itching. These may be signs of an allergic  reaction. This does not happen often. DIAGNOSIS Bedbug bites might look and feel like other types of insect bites. The bugs do not stay on the body like ticks or lice. They bite, drop off, and crawl away to hide. Your caregiver will probably:  Ask about your symptoms.  Ask about your recent activities and travel.  Check your skin for bedbug bites.  Ask you to check at home for signs of bedbugs. You should look for:  Spots or stains on the bed or nearby. This could be from bedbugs that were crushed or from their eggs or waste.  Bedbugs themselves. They are reddish-brown, oval, and flat. They do not fly. They are about the size of an apple seed.  Places to look for bedbugs include:  Beds. Check mattresses, headboards, box springs, and bed frames.  On drapes and curtains near the bed.  Under carpeting in the bedroom.  Behind electrical outlets.  Behind any wallpaper that is peeling.  Inside luggage. TREATMENT Most bedbug bites do not need treatment. They usually go away on their own in a few days. The bites are not dangerous. However, treatment may be needed if you have scratched so much that your skin has become infected. You may also need treatment if you are allergic to bedbug bites. Treatment options include:  A drug that stops swelling and itching (corticosteroid). Usually, a cream is rubbed on the  skin. If you have a bad rash, you may be given a corticosteroid pill.  Oral antihistamines. These are pills to help control itching.  Antibiotic medicines. An antibiotic may be prescribed for infected skin. HOME CARE INSTRUCTIONS   Take any medicine prescribed by your caregiver for your bites. Follow the directions carefully.  Consider wearing pajamas with long sleeves and pant legs.  Your bedroom may need to be treated. A pest control expert should make sure the bedbugs are gone. You may need to throw away mattresses or luggage. Ask the pest control expert what you can do to  keep the bedbugs from coming back. Common suggestions include:  Putting a plastic cover over your mattress.  Washing and drying your clothes and bedding in hot water and a hot dryer. The temperature should be hotter than 120 F (48.9 C). Bedbugs are killed by high temperatures.  Vacuuming carefully all around your bed. Vacuum in all cracks and crevices where the bugs might hide. Do this often.  Carefully checking all used furniture, bedding, or clothes that you bring into your house.  Eliminating bird nests and bat roosts.  If you get bedbug bites when traveling, check all your possessions carefully before bringing them into your house. If you find any bugs on clothes or in your luggage, consider throwing those items away. SEEK MEDICAL CARE IF:  You have red bug bites that keep coming back.  You have red bug bites that itch badly.  You have bug bites that cause a skin rash.  You have scratch marks that are red and sore. SEEK IMMEDIATE MEDICAL CARE IF: You have a fever. Document Released: 05/30/2010 Document Revised: 07/20/2011 Document Reviewed: 05/30/2010 [facility] Patient Information 2015 [place], [place]. This information is not intended to replace advice given to you by your health care provider. Make sure you discuss any questions you have with your health care provider.

## 2014-11-01 NOTE — ED Provider Notes (Signed)
CSN: [id number]     Arrival date & time 11/01/14  7989 History   First MD Initiated Contact with Patient 11/01/14 [phone]     Chief Complaint  Patient presents with  . Rash     (Consider location/radiation/quality/duration/timing/severity/associated sxs/prior Treatment) The history is provided by the patient and medical records.   This is a 69 year old male with past medical history significant for hypertension, GERD, osteoarthritis, hep C, presenting to the ED requesting treatment for bedbugs. Patient states the home he was currently renting is infested with bed bugs. He has been living there for the past several months. He denies any recent bug bites but states he still feels "itchy". He states he has made arrangements to move out of the home but wants treatment for bedbugs prior to moving into his new residence.  He denies fever, chills, sweats.  No rash currently.  No treatment tried PTA.  VSS.  Past Medical History  Diagnosis Date  . Hypertension   . Adenomatous colon polyp   . Vitiligo   . GERD (gastroesophageal reflux disease)     never required medication to treat  . Osteoarthritis     plans left hip replacement   . Rheumatoid arthritis   . Hepatitis C    Past Surgical History  Procedure Laterality Date  . Polypectomy    . Colonoscopy  2014  . Excision of nodule rt wrist -local anesthesia  yrs ago  . Multiple extractions with alveoloplasty N/A 06/22/2013    Procedure: Extraction of tooth #'s 3,4,5,8,9,10,12,21,22,23,27 with alveoloplasty, mandibular left torus reduction;  Surgeon: Lenn Cal, DDS;  Location: [facility];  Service: Oral Surgery;  Laterality: N/A;  . Total hip arthroplasty Left 10/03/2013    Procedure: LEFT TOTAL HIP ARTHROPLASTY ANTERIOR APPROACH;  Surgeon: Mauri Pole, MD;  Location: [facility];  Service: Orthopedics;  Laterality: Left;   Family History  Problem Relation Age of Onset  . Diabetes Mother   . Diabetes Father   . Hypertension Mother   .  Hypertension Father   . Diabetes Sister    History  Substance Use Topics  . Smoking status: Former Smoker -- 0.50 packs/day for 35 years    Quit date: 06/03/1995  . Smokeless tobacco: Never Used  . Alcohol Use: 2.4 oz/week    4 Cans of beer per week     Comment: last used marjuana about 09-20-13--denies use of any other recreational drugs.;      Review of Systems  Skin:       Bug bites  All other systems reviewed and are negative.     Allergies  Aspirin  Home Medications   Prior to Admission medications   Medication Sig Start Date End Date Taking? Authorizing Provider  amLODipine (NORVASC) 5 MG tablet Take 5 mg by mouth every morning.    Historical Provider, MD  docusate sodium 100 MG CAPS Take 100 mg by mouth 2 (two) times daily. 10/04/13   Danae Orleans, PA-C  ferrous sulfate 325 (65 FE) MG tablet Take 1 tablet (325 mg total) by mouth 3 (three) times daily after meals. 10/04/13   Danae Orleans, PA-C  hydrochlorothiazide (HYDRODIURIL) 25 MG tablet Take 25 mg by mouth every morning.     Historical Provider, MD  HYDROcodone-acetaminophen (NORCO) 5-325 MG per tablet Take 1 tablet by mouth every 6 (six) hours as needed for moderate pain. 06/15/14   Tatyana Kirichenko, PA-C  methocarbamol (ROBAXIN) 500 MG tablet Take 1 tablet (500 mg total) by mouth 2 (  two) times daily. 06/15/14   Tatyana Kirichenko, PA-C  metoprolol (LOPRESSOR) 100 MG tablet Take 50 mg by mouth 2 (two) times daily. 1/2 tablet twice a day    Historical Provider, MD  naproxen (NAPROSYN) 500 MG tablet Take 1 tablet (500 mg total) by mouth 2 (two) times daily. 06/15/14   Tatyana Kirichenko, PA-C  oxyCODONE (OXY IR/ROXICODONE) 5 MG immediate release tablet Take 1-3 tablets (5-15 mg total) by mouth every 4 (four) hours as needed for severe pain. 10/04/13   Danae Orleans, PA-C  polyethylene glycol (MIRALAX / GLYCOLAX) packet Take 17 g by mouth 2 (two) times daily. 10/04/13   Danae Orleans, PA-C  rivaroxaban (XARELTO) 10 MG TABS  tablet Take 1 tablet (10 mg total) by mouth daily. 10/04/13   Danae Orleans, PA-C  tacrolimus (PROTOPIC) 0.1 % ointment Apply 1 application topically 2 (two) times daily.    Historical Provider, MD   BP 118/72 mmHg  Pulse 60  Temp(Src) 98.8 F (37.1 C) (Oral)  Resp 18  SpO2 99% Physical Exam  Constitutional: He is oriented to person, place, and time. He appears well-developed and well-nourished. No distress.  HENT:  Head: Normocephalic and atraumatic.  Mouth/Throat: Oropharynx is clear and moist.  No oral lesions, airway patent  Eyes: Conjunctivae and EOM are normal. Pupils are equal, round, and reactive to light.  Neck: Normal range of motion. Neck supple.  Cardiovascular: Normal rate, regular rhythm and normal heart sounds.   Pulmonary/Chest: Effort normal and breath sounds normal. No respiratory distress. He has no wheezes.  Abdominal: Soft. Bowel sounds are normal. There is no tenderness. There is no guarding.  Musculoskeletal: Normal range of motion.  Neurological: He is alert and oriented to person, place, and time.  Skin: Skin is warm. He is not diaphoretic.  Vitiligo of face and neck Small bug bite noted to left side of neck; no superimposed infection or cellulitis; no rashes noted; no lesions on palms/soles  Psychiatric: He has a normal mood and affect.  Nursing note and vitals reviewed.   ED Course  Procedures (including critical care time) Labs Review Labs Reviewed - No data to display  Imaging Review No results found.   EKG Interpretation None      MDM   Final diagnoses:  Bug bites   69 year old male here desiring treatment for bedbug infestation of his current home. He has made arrangements to move to new residence, but wants treatment prior to moving him. He is a small bug bite noted to the left side of his neck, no other lesions noted. He has no rash currently. No oral lesions or airway edema. Vital signs stable on room air. Will treat with course of  permethrin. He was instructed to wash all clothes, linens, and time walls and hot water to kill any remaining mites.  Encouraged to FU with PCP.  Discussed plan with patient, he/she acknowledged understanding and agreed with plan of care.  Return precautions given for new or worsening symptoms.  Larene Pickett, PA-C 11/01/14 [place], MD 11/01/14 0830

## 2014-11-01 NOTE — ED Notes (Signed)
Pt reports that he has bed bugs and has been dealing with the rash for 2 months.  Reports no rash at this time but he wants to get "fumigated and walkaway from the house forever."

## 2015-03-22 ENCOUNTER — Encounter: Payer: Self-pay | Admitting: Internal Medicine

## 2015-06-27 ENCOUNTER — Other Ambulatory Visit: Payer: Commercial Managed Care - HMO

## 2015-06-27 DIAGNOSIS — B182 Chronic viral hepatitis C: Secondary | ICD-10-CM

## 2015-06-27 LAB — CBC WITH DIFFERENTIAL/PLATELET
BASOS ABS: 0 10*3/uL (ref 0.0–0.1)
BASOS PCT: 1 % (ref 0–1)
EOS ABS: 0.2 10*3/uL (ref 0.0–0.7)
EOS PCT: 4 % (ref 0–5)
HCT: 41.6 % (ref 39.0–52.0)
Hemoglobin: 14 g/dL (ref 13.0–17.0)
LYMPHS ABS: 1.2 10*3/uL (ref 0.7–4.0)
Lymphocytes Relative: 26 % (ref 12–46)
MCH: 34.7 pg — ABNORMAL HIGH (ref 26.0–34.0)
MCHC: 33.7 g/dL (ref 30.0–36.0)
MCV: 103 fL — AB (ref 78.0–100.0)
MPV: 9.5 fL (ref 8.6–12.4)
Monocytes Absolute: 0.5 10*3/uL (ref 0.1–1.0)
Monocytes Relative: 11 % (ref 3–12)
Neutro Abs: 2.8 10*3/uL (ref 1.7–7.7)
Neutrophils Relative %: 58 % (ref 43–77)
PLATELETS: 322 10*3/uL (ref 150–400)
RBC: 4.04 MIL/uL — ABNORMAL LOW (ref 4.22–5.81)
RDW: 12.6 % (ref 11.5–15.5)
WBC: 4.8 10*3/uL (ref 4.0–10.5)

## 2015-06-27 LAB — COMPREHENSIVE METABOLIC PANEL
ALT: 120 U/L — AB (ref 9–46)
AST: 80 U/L — ABNORMAL HIGH (ref 10–35)
Albumin: 3.6 g/dL (ref 3.6–5.1)
Alkaline Phosphatase: 72 U/L (ref 40–115)
BUN: 9 mg/dL (ref 7–25)
CO2: 29 mmol/L (ref 20–31)
Calcium: 9.1 mg/dL (ref 8.6–10.3)
Chloride: 99 mmol/L (ref 98–110)
Creat: 0.82 mg/dL (ref 0.70–1.25)
GLUCOSE: 112 mg/dL — AB (ref 65–99)
POTASSIUM: 4.2 mmol/L (ref 3.5–5.3)
Sodium: 133 mmol/L — ABNORMAL LOW (ref 135–146)
Total Bilirubin: 0.7 mg/dL (ref 0.2–1.2)
Total Protein: 7.2 g/dL (ref 6.1–8.1)

## 2015-06-27 LAB — IRON: IRON: 238 ug/dL — AB (ref 50–180)

## 2015-06-27 LAB — PROTIME-INR
INR: 1.05 (ref <1.50)
Prothrombin Time: 13.8 seconds (ref 11.6–15.2)

## 2015-06-28 LAB — HEPATITIS B CORE ANTIBODY, TOTAL: Hep B Core Total Ab: REACTIVE — AB

## 2015-06-28 LAB — HEPATITIS B SURFACE ANTIBODY,QUALITATIVE: Hep B S Ab: NEGATIVE

## 2015-06-28 LAB — HIV ANTIBODY (ROUTINE TESTING W REFLEX): HIV: NONREACTIVE

## 2015-06-28 LAB — HEPATITIS B SURFACE ANTIGEN: HEP B S AG: NEGATIVE

## 2015-06-28 LAB — HEPATITIS A ANTIBODY, TOTAL: HEP A TOTAL AB: REACTIVE — AB

## 2015-06-28 LAB — ANA: ANA: NEGATIVE

## 2015-07-01 LAB — HCV RNA, QUANT REAL-TIME PCR W/REFLEX
HCV RNA, PCR, QN (Log): 6.19 LogIU/mL — ABNORMAL HIGH
HCV RNA, PCR, QN: 1550000 IU/mL — ABNORMAL HIGH

## 2015-07-01 LAB — HCV RNA,LIPA RFLX NS5A DRUG RESIST

## 2015-07-04 LAB — HCV RNA NS5A DRUG RESISTANCE

## 2015-07-24 ENCOUNTER — Encounter: Payer: Self-pay | Admitting: Internal Medicine

## 2015-07-24 ENCOUNTER — Ambulatory Visit (INDEPENDENT_AMBULATORY_CARE_PROVIDER_SITE_OTHER): Payer: Commercial Managed Care - HMO | Admitting: Internal Medicine

## 2015-07-24 VITALS — BP 137/85 | HR 63 | Temp 98.0°F | Ht 66.5 in | Wt 158.0 lb

## 2015-07-24 DIAGNOSIS — B182 Chronic viral hepatitis C: Secondary | ICD-10-CM | POA: Diagnosis not present

## 2015-07-24 MED ORDER — LEDIPASVIR-SOFOSBUVIR 90-400 MG PO TABS: 1.0000 | ORAL_TABLET | Freq: Every day | ORAL | Status: AC

## 2015-07-24 NOTE — Patient Instructions (Signed)
Date 07/24/2015  Dear Mr. Axtell, As discussed in the [facility], your hepatitis C therapy will include the following medications:          Harvoni 90mg /400mg  tablet:           Take 1 tablet by mouth once daily   Please note that ALL MEDICATIONS WILL START ON THE SAME DATE for a total of 12 weeks. ---------------------------------------------------------------- Your HCV Treatment Start Date: TBA   Your HCV genotype:  1a    Liver Fibrosis: TBD    ---------------------------------------------------------------- YOUR PHARMACY CONTACT:   [facility] [place] Phone: [phone] Hours: Monday to Friday 7:30 am to 6:00 pm   Please always contact your pharmacy at least 3-4 business days before you run out of medications to ensure your next month's medication is ready or 1 week prior to running out if you receive it by mail.  Remember, each prescription is for 28 days. ---------------------------------------------------------------- GENERAL NOTES REGARDING YOUR HEPATITIS C MEDICATION:  SOFOSBUVIR/LEDIPASVIR (HARVONI): - Harvoni tablet is taken daily with OR without food. - The tablets are orange. - The tablets should be stored at room temperature.  - Acid reducing agents such as H2 blockers (ie. Pepcid (famotidine), Zantac (ranitidine), Tagamet (cimetidine), Axid (nizatidine) and proton pump inhibitors (ie. Prilosec (omeprazole), Protonix (pantoprazole), Nexium (esomeprazole), or Aciphex (rabeprazole)) can decrease effectiveness of Harvoni. Do not take until you have discussed with a health care provider.    -Antacids that contain magnesium and/or aluminum hydroxide (ie. Milk of Magensia, Rolaids, Gaviscon, Maalox, Mylanta, an dArthritis Pain Formula)can reduce absorption of Harvoni, so take them at least 4 hours before or after Harvoni.  -Calcium carbonate (calcium supplements or antacids such as Tums, Caltrate,  Os-Cal)needs to be taken at least 4 hours hours before or after Harvoni.  -St. John's wort or any products that contain St. John's wort like some herbal supplements  Please inform the office prior to starting any of these medications.  - The common side effects associated with Harvoni include:      1. Fatigue      2. Headache      3. Nausea      4. Diarrhea      5. Insomnia  Please note that this only lists the most common side effects and is NOT a comprehensive list of the potential side effects of these medications. For more information, please review the drug information sheets that come with your medication package from the pharmacy.  ---------------------------------------------------------------- GENERAL HELPFUL HINTS ON HCV THERAPY: 1. Stay well-hydrated. 2. Notify the [facility] of any changes in your other over-the-counter/herbal or prescription medications. 3. If you miss a dose of your medication, take the missed dose as soon as you remember. Return to your regular time/dose schedule the next day.  4.  Do not stop taking your medications without first talking with your healthcare provider. 5.  You may take Tylenol (acetaminophen), as long as the dose is less than 2000 mg (OR no more than 4 tablets of the Tylenol Extra Strengths 500mg  tablet) in 24 hours. 6.  You will see our pharmacist-specialist within the first 2 weeks of starting your medication. 7.  You will need to obtain routine labs around week 4 and12 weeks after starting and then 3 to 6 months after finishing Harvoni.    Scharlene Gloss, [place] for [place],  [place]  [address] [phone]

## 2015-07-24 NOTE — Progress Notes (Signed)
[facility] for [facility]   CC: consideration for treatment for chronic hepatitis C  HPI:  +Jimmy Castaneda is a 70 y.o. male who presents for initial evaluation and management of chronic hepatitis C.  Patient tested positive over 10 years ago, labs noted from 2006. Hepatitis C-associated risk factors present are: IV drug abuse (details: over 20 years ago). Patient denies renal dialysis, sexual contact with person with liver disease, tattoos. Patient has had other studies performed. Results: hepatitis C RNA by PCR, result: positive. Patient has had prior treatment for Hepatitis C. Patient does not have a past history of liver disease. Patient does not have a family history of liver disease. Patient does not  have associated signs or symptoms related to liver disease.  Labs reviewed and confirm chronic hepatitis C with a positive viral load.   Records reviewed from PCP,  Previous liver biopsy by Dr. Maceo Pro which was F1.  Tells me he was on treatment with interferon (and likely ribavirin) for about 12 weeks and with negative early viral load thought he was cured and did not return or complete treatment.  Tolerated treatment well.     Patient does have documented immunity to Hepatitis A. Patient does have documented immunity to Hepatitis B.    Review of Systems:   Constitutional: negative for fatigue and malaise Gastrointestinal: negative for diarrhea Musculoskeletal: negative for myalgias and arthralgias All other systems reviewed and are negative      Past Medical History  Diagnosis Date  . Hypertension   . Adenomatous colon polyp   . Vitiligo   . GERD (gastroesophageal reflux disease)     never required medication to treat  . Osteoarthritis     plans left hip replacement   . Rheumatoid arthritis ([place])   . Hepatitis C     Prior to Admission medications   Medication Sig Start Date End Date Taking? Authorizing Provider  amLODipine (NORVASC) 5 MG tablet Take 5 mg by  mouth every morning.   Yes Historical Provider, MD  hydrochlorothiazide (HYDRODIURIL) 25 MG tablet Take 25 mg by mouth every morning.    Yes Historical Provider, MD  metoprolol (LOPRESSOR) 100 MG tablet Take 50 mg by mouth 2 (two) times daily. 1/2 tablet twice a day   Yes Historical Provider, MD  permethrin (ELIMITE) 5 % cream Apply to entire body other than face - let sit for 14 hours then wash off, may repeat in 1 week if still having symptoms 11/01/14  Yes Larene Pickett, PA-C  tacrolimus (PROTOPIC) 0.1 % ointment Apply 1 application topically 2 (two) times daily.   Yes Historical Provider, MD  Ledipasvir-Sofosbuvir (HARVONI) 90-400 MG TABS Take 1 tablet by mouth daily. 07/24/15   Thayer Headings, MD    Allergies  Allergen Reactions  . Aspirin Hives    Social History  Substance Use Topics  . Smoking status: Current Every Day Smoker -- 0.50 packs/day for 35 years  . Smokeless tobacco: Never Used  . Alcohol Use: 2.4 oz/week    4 Cans of beer per week     Comment: last used marjuana about 09-20-13--denies use of any other recreational drugs.;      Family History  Problem Relation Age of Onset  . Diabetes Mother   . Diabetes Father   . Hypertension Mother   . Hypertension Father   . Diabetes Sister      Objective:  Constitutional: in no apparent distress and alert,  Filed Vitals:  07/24/15 1447  BP: 137/85  Pulse: 63  Temp: 98 F (36.7 C)   Eyes: anicteric Cardiovascular: Cor RRR and No murmurs Respiratory: CTA B; normal respiratory effort Gastrointestinal: Bowel sounds are normal, liver is not enlarged, spleen is not enlarged Musculoskeletal: peripheral pulses normal, no pedal edema, no clubbing or cyanosis Skin: negatives: no rash; no porphyria cutanea tarda Lymphatic: no cervical lymphadenopathy   Laboratory Genotype: No results found for: HCVGENOTYPE HCV viral load: No results found for: HCVQUANT Lab Results  Component Value Date   WBC 4.8 06/27/2015   HGB 14.0  06/27/2015   HCT 41.6 06/27/2015   MCV 103.0* 06/27/2015   PLT 322 06/27/2015    Lab Results  Component Value Date   CREATININE 0.82 06/27/2015   BUN 9 06/27/2015   NA 133* 06/27/2015   K 4.2 06/27/2015   CL 99 06/27/2015   CO2 29 06/27/2015    Lab Results  Component Value Date   ALT 120* 06/27/2015   AST 80* 06/27/2015   ALKPHOS 72 06/27/2015     Labs and history reviewed and show CHILD-PUGH A  5-6 points: Child class A 7-9 points: Child class B 10-15 points: Child class C  Lab Results  Component Value Date   INR 1.05 06/27/2015   BILITOT 0.7 06/27/2015   ALBUMIN 3.6 06/27/2015     Assessment: New Patient with Chronic Hepatitis C genotype 1a, untreated.  I discussed with the patient the lab findings that confirm chronic hepatitis C as well as the natural history and progression of disease including about 30% of people who develop cirrhosis of the liver if left untreated and once cirrhosis is established there is a 2-7% risk per year of liver cancer and liver failure.  I discussed the importance of treatment and benefits in reducing the risk, even if significant liver fibrosis exists.   Plan: 1) Patient counseled extensively on limiting acetaminophen to no more than 2 grams daily, avoidance of alcohol. 2) Transmission discussed with patient including sexual transmission, sharing razors and toothbrush.   3) Will need referral to gastroenterology if concern for cirrhosis 4) Will need referral for substance abuse counseling: No.; Further work up to include urine drug screen  No. 5) Will prescribe Harvoni for 12 weeks 6) Hepatitis A vaccine No. 7) Hepatitis B vaccine No. 8) Pneumovax vaccine if concern for cirrhosis 9) Further work up to include liver staging with elastography 10) will follow up after starting medication

## 2015-07-26 ENCOUNTER — Telehealth: Payer: Self-pay | Admitting: *Deleted

## 2015-07-26 NOTE — Telephone Encounter (Signed)
Patient notified of ultrasound appt for 08/15/15 at 10:45 AM at [facility]. Nothing to eat or drink after midnight. Myrtis Hopping

## 2015-08-15 ENCOUNTER — Ambulatory Visit (HOSPITAL_COMMUNITY)
Admission: RE | Admit: 2015-08-15 | Discharge: 2015-08-15 | Disposition: A | Discharge status: Home / Self Care | Payer: Commercial Managed Care - HMO | Source: Ambulatory Visit | Attending: Internal Medicine | Admitting: Internal Medicine

## 2015-08-15 DIAGNOSIS — B182 Chronic viral hepatitis C: Secondary | ICD-10-CM | POA: Diagnosis not present

## 2015-08-21 MED FILL — *HARVONI 90-400 MG TABLET: 90-400 | 28 days supply | Qty: 28 | Fill #0

## 2015-08-26 ENCOUNTER — Encounter: Payer: Self-pay | Admitting: Pharmacy Technician

## 2015-09-10 ENCOUNTER — Ambulatory Visit (INDEPENDENT_AMBULATORY_CARE_PROVIDER_SITE_OTHER): Payer: Commercial Managed Care - HMO | Admitting: Pharmacist Clinician (PhC)/ Clinical Pharmacy Specialist

## 2015-09-10 DIAGNOSIS — B182 Chronic viral hepatitis C: Secondary | ICD-10-CM

## 2015-09-10 NOTE — Patient Instructions (Signed)
Cont to take Harvoni 1 tab once a day and do not miss doses!!!!

## 2015-09-10 NOTE — Progress Notes (Addendum)
Patient ID: Jimmy Castaneda, male   DOB: Jan 22, 1946, 70 y.o.   MRN: [id number] HPI: Jimmy Castaneda is a 70 y.o. male who is here for his pharmacy follow for his hep C.   No results found for: HCVGENOTYPE, HEPCGENOTYPE  Allergies: Allergies  Allergen Reactions  . Aspirin Hives    Vitals:    Past Medical History: Past Medical History  Diagnosis Date  . Hypertension   . Adenomatous colon polyp   . Vitiligo   . GERD (gastroesophageal reflux disease)     never required medication to treat  . Osteoarthritis     plans left hip replacement   . Rheumatoid arthritis ([place])   . Hepatitis C     Social History: Social History   Social History  . Marital Status: Married    Spouse Name: N/A  . Number of Children: 4  . Years of Education: N/A   Social History Main Topics  . Smoking status: Current Every Day Smoker -- 0.50 packs/day for 35 years  . Smokeless tobacco: Never Used  . Alcohol Use: 2.4 oz/week    4 Cans of beer per week     Comment: last used marjuana about 09-20-13--denies use of any other recreational drugs.;    . Drug Use: No     Comment: marijuana   . Sexual Activity:    Partners: Female   Other Topics Concern  . Not on file   Social History Narrative    Labs: HEP B S AB (no units)  Date Value  06/27/2015 NEG   HEPATITIS B SURFACE AG (no units)  Date Value  06/27/2015 NEGATIVE    No results found for: HCVGENOTYPE, HEPCGENOTYPE  No flowsheet data found.  AST (U/L)  Date Value  06/27/2015 80*   ALT (U/L)  Date Value  06/27/2015 120*   INR (no units)  Date Value  06/27/2015 1.05  09/27/2013 1.06    CrCl: CrCl cannot be calculated (Unknown ideal weight.).  Fibrosis Score: F2/3 as assessed by [facility]  Child-Pugh Score: Class A  Previous Treatment Regimen: ~1 month PEG/Riba?  Assessment: Kydon started on his Harvoni on 4/17 and has been doing well on it. He stated that he has missed 1 dose of harvoni because he messed up with  the timing. He was taking the Harvoni separately from his BP meds. Told him to take all of the meds at the same time. He is going to try to do that from now on. Scheduled him to come back for lab in 2 weeks and f/u Dr. Linus Salmons in June.   Recommendations:  Cont Harvoni 1 PO qday x 3 months F/u in 2 wks for labs F/u with Dr. Linus Salmons in June  [name] [place], [place].D., BCPS, AAHIVP Clinical Infectious [place] for Infectious Disease 09/10/2015, 11:13 AM

## 2015-09-17 MED FILL — *HARVONI 90-400 MG TABLET: 90-400 | 28 days supply | Qty: 28 | Fill #1

## 2015-09-24 ENCOUNTER — Other Ambulatory Visit: Payer: Self-pay

## 2015-10-14 MED FILL — *HARVONI 90-400 MG TABLET: 90-400 | 28 days supply | Qty: 28 | Fill #2

## 2015-11-05 ENCOUNTER — Ambulatory Visit (INDEPENDENT_AMBULATORY_CARE_PROVIDER_SITE_OTHER): Payer: Commercial Managed Care - HMO | Admitting: Internal Medicine

## 2015-11-05 ENCOUNTER — Encounter: Payer: Self-pay | Admitting: Internal Medicine

## 2015-11-05 VITALS — BP 132/77 | HR 52 | Temp 97.6°F | Ht 68.0 in | Wt 154.0 lb

## 2015-11-05 DIAGNOSIS — B182 Chronic viral hepatitis C: Secondary | ICD-10-CM | POA: Diagnosis not present

## 2015-11-05 DIAGNOSIS — K7400 Hepatic fibrosis, unspecified: Secondary | ICD-10-CM | POA: Insufficient documentation

## 2015-11-05 LAB — COMPLETE METABOLIC PANEL WITH GFR
ALBUMIN: 3.8 g/dL (ref 3.6–5.1)
ALK PHOS: 60 U/L (ref 40–115)
ALT: 9 U/L (ref 9–46)
AST: 14 U/L (ref 10–35)
BUN: 10 mg/dL (ref 7–25)
CALCIUM: 9.1 mg/dL (ref 8.6–10.3)
CHLORIDE: 104 mmol/L (ref 98–110)
CO2: 21 mmol/L (ref 20–31)
Creat: 0.8 mg/dL (ref 0.70–1.25)
GFR, Est African American: >89 mL/min (ref >=60)
Glucose, Bld: 102 mg/dL — ABNORMAL HIGH (ref 65–99)
POTASSIUM: 3.7 mmol/L (ref 3.5–5.3)
Sodium: 137 mmol/L (ref 135–146)
Total Bilirubin: 0.7 mg/dL (ref 0.2–1.2)
Total Protein: 6.8 g/dL (ref 6.1–8.1)

## 2015-11-05 NOTE — Assessment & Plan Note (Signed)
Doing well.  On last bottle.  Labs today and rtc 2 months for end of treatment lab

## 2015-11-05 NOTE — Assessment & Plan Note (Addendum)
Discussed results.  Will repeat elastography at 1 year. Normal platelets and albumin and spleen not enlarged.

## 2015-11-05 NOTE — Progress Notes (Signed)
   Subjective:    Patient ID: Jimmy Castaneda, male    DOB: 07/15/1945, 70 y.o.   MRN: [id number]  HPI Here for follow up of hepatitis C.    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  Also with F2/3 on elastography.  No complaints.  Hepatitis A and B immune.     Review of Systems  Constitutional: Negative for fatigue.  Skin: Negative for rash.  Neurological: Negative for dizziness and headaches.       Objective:   Physical Exam  Constitutional: He appears well-developed and well-nourished. No distress.  Eyes: No scleral icterus.  Cardiovascular: Normal rate, regular rhythm and normal heart sounds.   Skin: No rash noted.          Assessment & Plan:

## 2015-11-06 LAB — HEPATITIS C RNA QUANTITATIVE: HCV QUANT: NOT DETECTED [IU]/mL (ref <15)

## 2015-11-11 ENCOUNTER — Other Ambulatory Visit: Payer: Self-pay | Admitting: Internal Medicine

## 2015-12-31 ENCOUNTER — Ambulatory Visit: Payer: Self-pay | Admitting: Internal Medicine

## 2016-06-13 ENCOUNTER — Observation Stay (HOSPITAL_COMMUNITY)
Admission: EM | Admit: 2016-06-13 | Discharge: 2016-06-15 | Disposition: A | Discharge status: Home / Self Care | Payer: Medicare HMO | Attending: Surgery | Admitting: Surgery

## 2016-06-13 ENCOUNTER — Observation Stay (HOSPITAL_COMMUNITY): Payer: Medicare HMO | Admitting: Certified Registered"

## 2016-06-13 ENCOUNTER — Encounter (HOSPITAL_COMMUNITY)
Admission: EM | Disposition: A | Discharge status: Home / Self Care | Payer: Self-pay | Source: Home / Self Care | Attending: Emergency Medicine

## 2016-06-13 ENCOUNTER — Emergency Department (HOSPITAL_COMMUNITY): Payer: Medicare HMO

## 2016-06-13 ENCOUNTER — Encounter (HOSPITAL_COMMUNITY): Payer: Self-pay | Admitting: Emergency Medicine

## 2016-06-13 DIAGNOSIS — Z86010 Personal history of colonic polyps: Secondary | ICD-10-CM | POA: Insufficient documentation

## 2016-06-13 DIAGNOSIS — Z96642 Presence of left artificial hip joint: Secondary | ICD-10-CM | POA: Insufficient documentation

## 2016-06-13 DIAGNOSIS — I10 Essential (primary) hypertension: Secondary | ICD-10-CM | POA: Insufficient documentation

## 2016-06-13 DIAGNOSIS — Z9889 Other specified postprocedural states: Secondary | ICD-10-CM | POA: Insufficient documentation

## 2016-06-13 DIAGNOSIS — K219 Gastro-esophageal reflux disease without esophagitis: Secondary | ICD-10-CM | POA: Diagnosis not present

## 2016-06-13 DIAGNOSIS — R338 Other retention of urine: Secondary | ICD-10-CM | POA: Diagnosis not present

## 2016-06-13 DIAGNOSIS — L80 Vitiligo: Secondary | ICD-10-CM | POA: Diagnosis not present

## 2016-06-13 DIAGNOSIS — K740 Hepatic fibrosis: Secondary | ICD-10-CM | POA: Diagnosis not present

## 2016-06-13 DIAGNOSIS — L0231 Cutaneous abscess of buttock: Secondary | ICD-10-CM | POA: Diagnosis present

## 2016-06-13 DIAGNOSIS — B182 Chronic viral hepatitis C: Secondary | ICD-10-CM | POA: Diagnosis not present

## 2016-06-13 DIAGNOSIS — Z833 Family history of diabetes mellitus: Secondary | ICD-10-CM | POA: Diagnosis not present

## 2016-06-13 DIAGNOSIS — Z8249 Family history of ischemic heart disease and other diseases of the circulatory system: Secondary | ICD-10-CM | POA: Diagnosis not present

## 2016-06-13 DIAGNOSIS — F17210 Nicotine dependence, cigarettes, uncomplicated: Secondary | ICD-10-CM | POA: Insufficient documentation

## 2016-06-13 DIAGNOSIS — N401 Enlarged prostate with lower urinary tract symptoms: Secondary | ICD-10-CM | POA: Diagnosis not present

## 2016-06-13 DIAGNOSIS — K5900 Constipation, unspecified: Secondary | ICD-10-CM | POA: Diagnosis not present

## 2016-06-13 DIAGNOSIS — M069 Rheumatoid arthritis, unspecified: Secondary | ICD-10-CM | POA: Diagnosis not present

## 2016-06-13 DIAGNOSIS — K648 Other hemorrhoids: Secondary | ICD-10-CM | POA: Insufficient documentation

## 2016-06-13 DIAGNOSIS — Z79899 Other long term (current) drug therapy: Secondary | ICD-10-CM | POA: Insufficient documentation

## 2016-06-13 DIAGNOSIS — Z886 Allergy status to analgesic agent status: Secondary | ICD-10-CM | POA: Diagnosis not present

## 2016-06-13 HISTORY — PX: INCISION AND DRAINAGE PERIRECTAL ABSCESS: SHX1804

## 2016-06-13 LAB — CBC WITH DIFFERENTIAL/PLATELET
Basophils Absolute: 0 10*3/uL (ref 0.0–0.1)
Basophils Relative: 0 %
EOS ABS: 0.1 10*3/uL (ref 0.0–0.7)
Eosinophils Relative: 1 %
HCT: 41.1 % (ref 39.0–52.0)
Hemoglobin: 14.2 g/dL (ref 13.0–17.0)
Lymphocytes Relative: 6 %
Lymphs Abs: 0.9 10*3/uL (ref 0.7–4.0)
MCH: 34.3 pg — ABNORMAL HIGH (ref 26.0–34.0)
MCHC: 34.5 g/dL (ref 30.0–36.0)
MCV: 99.3 fL (ref 78.0–100.0)
MONO ABS: 1.9 10*3/uL — AB (ref 0.1–1.0)
MONOS PCT: 12 %
NEUTROS PCT: 81 %
Neutro Abs: 13 10*3/uL — ABNORMAL HIGH (ref 1.7–7.7)
Platelets: 262 10*3/uL (ref 150–400)
RBC: 4.14 MIL/uL — ABNORMAL LOW (ref 4.22–5.81)
RDW: 12.6 % (ref 11.5–15.5)
WBC: 16 10*3/uL — ABNORMAL HIGH (ref 4.0–10.5)

## 2016-06-13 LAB — COMPREHENSIVE METABOLIC PANEL
ALT: 12 U/L — AB (ref 17–63)
AST: 25 U/L (ref 15–41)
Albumin: 3.7 g/dL (ref 3.5–5.0)
Alkaline Phosphatase: 75 U/L (ref 38–126)
Anion gap: 12 (ref 5–15)
BUN: 14 mg/dL (ref 6–20)
CALCIUM: 9.4 mg/dL (ref 8.9–10.3)
CO2: 28 mmol/L (ref 22–32)
CREATININE: 1.07 mg/dL (ref 0.61–1.24)
Chloride: 92 mmol/L — ABNORMAL LOW (ref 101–111)
GFR calc non Af Amer: >60 mL/min (ref >60)
GLUCOSE: 137 mg/dL — AB (ref 65–99)
Potassium: 3.7 mmol/L (ref 3.5–5.1)
SODIUM: 132 mmol/L — AB (ref 135–145)
Total Bilirubin: 1.7 mg/dL — ABNORMAL HIGH (ref 0.3–1.2)
Total Protein: 7.8 g/dL (ref 6.5–8.1)

## 2016-06-13 SURGERY — INCISION AND DRAINAGE, ABSCESS, PERIRECTAL
Anesthesia: General | Site: Buttocks | Laterality: Left

## 2016-06-13 MED ORDER — VANCOMYCIN HCL IN DEXTROSE 1-5 GM/200ML-% IV SOLN: 1000.0000 mg | Freq: Once | INTRAVENOUS | Status: DC

## 2016-06-13 MED ORDER — ONDANSETRON HCL 4 MG/2ML IJ SOLN
INTRAMUSCULAR | Status: AC
  Filled 2016-06-13: qty 2

## 2016-06-13 MED ORDER — SUCCINYLCHOLINE CHLORIDE 200 MG/10ML IV SOSY
PREFILLED_SYRINGE | INTRAVENOUS | Status: DC | PRN
  Administered 2016-06-13: 80 mg via INTRAVENOUS

## 2016-06-13 MED ORDER — AMLODIPINE BESYLATE 5 MG PO TABS
5.0000 mg | ORAL_TABLET | Freq: Every morning | ORAL | Status: DC
  Administered 2016-06-14 – 2016-06-15 (×2): 5 mg via ORAL
  Filled 2016-06-13 (×2): qty 1

## 2016-06-13 MED ORDER — METOPROLOL TARTRATE 50 MG PO TABS
50.0000 mg | ORAL_TABLET | Freq: Two times a day (BID) | ORAL | Status: DC
  Administered 2016-06-13 – 2016-06-15 (×4): 50 mg via ORAL
  Filled 2016-06-13 (×4): qty 1

## 2016-06-13 MED ORDER — SODIUM CHLORIDE 0.9 % IV BOLUS (SEPSIS)
1000.0000 mL | Freq: Once | INTRAVENOUS | Status: AC
  Administered 2016-06-13: 1000 mL via INTRAVENOUS

## 2016-06-13 MED ORDER — LIDOCAINE 2% (20 MG/ML) 5 ML SYRINGE
INTRAMUSCULAR | Status: AC
  Filled 2016-06-13: qty 5

## 2016-06-13 MED ORDER — IOPAMIDOL (ISOVUE-300) INJECTION 61%
INTRAVENOUS | Status: AC
Start: 2016-06-13 — End: 2016-06-13
  Administered 2016-06-13: 75 mL
  Filled 2016-06-13: qty 100

## 2016-06-13 MED ORDER — MEPERIDINE HCL 25 MG/ML IJ SOLN: 6.2500 mg | INTRAMUSCULAR | Status: DC | PRN

## 2016-06-13 MED ORDER — DOCUSATE SODIUM 100 MG PO CAPS
100.0000 mg | ORAL_CAPSULE | Freq: Two times a day (BID) | ORAL | Status: DC
  Administered 2016-06-13 – 2016-06-15 (×4): 100 mg via ORAL
  Filled 2016-06-13 (×4): qty 1

## 2016-06-13 MED ORDER — MIDAZOLAM HCL 5 MG/5ML IJ SOLN
INTRAMUSCULAR | Status: DC | PRN
  Administered 2016-06-13: 2 mg via INTRAVENOUS

## 2016-06-13 MED ORDER — PROPOFOL 10 MG/ML IV BOLUS
INTRAVENOUS | Status: AC
  Filled 2016-06-13: qty 20

## 2016-06-13 MED ORDER — ACETAMINOPHEN 325 MG PO TABS
650.0000 mg | ORAL_TABLET | Freq: Four times a day (QID) | ORAL | Status: DC | PRN
  Administered 2016-06-14: 650 mg via ORAL
  Filled 2016-06-13: qty 2

## 2016-06-13 MED ORDER — VANCOMYCIN HCL 10 G IV SOLR
1500.0000 mg | Freq: Once | INTRAVENOUS | Status: AC
  Administered 2016-06-13: 1500 mg via INTRAVENOUS
  Filled 2016-06-13: qty 1500

## 2016-06-13 MED ORDER — DIPHENHYDRAMINE HCL 50 MG/ML IJ SOLN: 12.5000 mg | Freq: Four times a day (QID) | INTRAMUSCULAR | Status: DC | PRN

## 2016-06-13 MED ORDER — PIPERACILLIN-TAZOBACTAM 3.375 G IVPB
3.3750 g | Freq: Three times a day (TID) | INTRAVENOUS | Status: DC
  Administered 2016-06-13 – 2016-06-14 (×3): 3.375 g via INTRAVENOUS
  Filled 2016-06-13 (×3): qty 50

## 2016-06-13 MED ORDER — POLYETHYLENE GLYCOL 3350 17 G PO PACK
17.0000 g | PACK | Freq: Every day | ORAL | Status: DC | PRN
  Administered 2016-06-14: 17 g via ORAL
  Filled 2016-06-13: qty 1

## 2016-06-13 MED ORDER — BISACODYL 10 MG RE SUPP: 10.0000 mg | Freq: Every day | RECTAL | Status: DC | PRN

## 2016-06-13 MED ORDER — HYDROMORPHONE HCL 1 MG/ML IJ SOLN: 0.2500 mg | INTRAMUSCULAR | Status: DC | PRN

## 2016-06-13 MED ORDER — 0.9 % SODIUM CHLORIDE (POUR BTL) OPTIME
TOPICAL | Status: DC | PRN
  Administered 2016-06-13: 1000 mL

## 2016-06-13 MED ORDER — IBUPROFEN 400 MG PO TABS: 600.0000 mg | ORAL_TABLET | Freq: Four times a day (QID) | ORAL | Status: DC | PRN

## 2016-06-13 MED ORDER — SUCCINYLCHOLINE CHLORIDE 200 MG/10ML IV SOSY
PREFILLED_SYRINGE | INTRAVENOUS | Status: AC
  Filled 2016-06-13: qty 10

## 2016-06-13 MED ORDER — TRAMADOL HCL 50 MG PO TABS
50.0000 mg | ORAL_TABLET | Freq: Four times a day (QID) | ORAL | Status: DC | PRN
  Administered 2016-06-14: 50 mg via ORAL
  Filled 2016-06-13: qty 1

## 2016-06-13 MED ORDER — PROMETHAZINE HCL 25 MG/ML IJ SOLN: 6.2500 mg | INTRAMUSCULAR | Status: DC | PRN

## 2016-06-13 MED ORDER — MIDAZOLAM HCL 2 MG/2ML IJ SOLN
INTRAMUSCULAR | Status: AC
  Filled 2016-06-13: qty 2

## 2016-06-13 MED ORDER — LACTATED RINGERS IV SOLN
INTRAVENOUS | Status: DC | PRN
  Administered 2016-06-13: 15:00:00 via INTRAVENOUS

## 2016-06-13 MED ORDER — ONDANSETRON HCL 4 MG/2ML IJ SOLN: 4.0000 mg | Freq: Four times a day (QID) | INTRAMUSCULAR | Status: DC | PRN

## 2016-06-13 MED ORDER — METRONIDAZOLE IN NACL 5-0.79 MG/ML-% IV SOLN
500.0000 mg | Freq: Once | INTRAVENOUS | Status: AC
  Administered 2016-06-13: 500 mg via INTRAVENOUS
  Filled 2016-06-13: qty 100

## 2016-06-13 MED ORDER — GLYCOPYRROLATE 0.2 MG/ML IJ SOLN
INTRAMUSCULAR | Status: DC | PRN
  Administered 2016-06-13: 0.2 mg via INTRAVENOUS

## 2016-06-13 MED ORDER — ONDANSETRON 4 MG PO TBDP: 4.0000 mg | ORAL_TABLET | Freq: Four times a day (QID) | ORAL | Status: DC | PRN

## 2016-06-13 MED ORDER — LIDOCAINE 2% (20 MG/ML) 5 ML SYRINGE
INTRAMUSCULAR | Status: DC | PRN
  Administered 2016-06-13: 100 mg via INTRAVENOUS

## 2016-06-13 MED ORDER — DIPHENHYDRAMINE HCL 12.5 MG/5ML PO ELIX: 12.5000 mg | ORAL_SOLUTION | Freq: Four times a day (QID) | ORAL | Status: DC | PRN

## 2016-06-13 MED ORDER — ENOXAPARIN SODIUM 40 MG/0.4ML ~~LOC~~ SOLN
40.0000 mg | SUBCUTANEOUS | Status: DC
  Administered 2016-06-14: 40 mg via SUBCUTANEOUS
  Filled 2016-06-13: qty 0.4

## 2016-06-13 MED ORDER — SUFENTANIL CITRATE 50 MCG/ML IV SOLN
INTRAVENOUS | Status: AC
  Filled 2016-06-13: qty 1

## 2016-06-13 MED ORDER — SUFENTANIL CITRATE 50 MCG/ML IV SOLN
INTRAVENOUS | Status: DC | PRN
  Administered 2016-06-13: 10 ug via INTRAVENOUS
  Administered 2016-06-13: 5 ug via INTRAVENOUS
  Administered 2016-06-13: 10 ug via INTRAVENOUS

## 2016-06-13 MED ORDER — PIPERACILLIN-TAZOBACTAM 3.375 G IVPB 30 MIN: 3.3750 g | Freq: Once | INTRAVENOUS | Status: DC

## 2016-06-13 MED ORDER — MORPHINE SULFATE (PF) 4 MG/ML IV SOLN
4.0000 mg | Freq: Once | INTRAVENOUS | Status: AC
  Administered 2016-06-13: 4 mg via INTRAVENOUS
  Filled 2016-06-13: qty 1

## 2016-06-13 MED ORDER — PROPOFOL 10 MG/ML IV BOLUS
INTRAVENOUS | Status: DC | PRN
  Administered 2016-06-13: 60 mg via INTRAVENOUS
  Administered 2016-06-13: 140 mg via INTRAVENOUS

## 2016-06-13 MED ORDER — HYDROMORPHONE HCL 2 MG/ML IJ SOLN
0.5000 mg | INTRAMUSCULAR | Status: DC | PRN
  Administered 2016-06-14: 0.5 mg via INTRAVENOUS
  Filled 2016-06-13 (×2): qty 1

## 2016-06-13 MED ORDER — SODIUM CHLORIDE 0.9 % IV SOLN
INTRAVENOUS | Status: DC
  Administered 2016-06-13: 19:00:00 via INTRAVENOUS
  Administered 2016-06-14: 1 mL via INTRAVENOUS

## 2016-06-13 MED ORDER — ACETAMINOPHEN 650 MG RE SUPP: 650.0000 mg | Freq: Four times a day (QID) | RECTAL | Status: DC | PRN

## 2016-06-13 MED ORDER — SODIUM CHLORIDE 0.9 % IJ SOLN
INTRAMUSCULAR | Status: AC
Start: 2016-06-13 — End: 2016-06-13
  Filled 2016-06-13: qty 10

## 2016-06-13 SURGICAL SUPPLY — 33 items
BLADE SURG 15 STRL LF DISP TIS (BLADE) ×1 IMPLANT
BLADE SURG 15 STRL SS (BLADE)
BNDG GAUZE ELAST 4 BULKY (GAUZE/BANDAGES/DRESSINGS) ×1 IMPLANT
BRIEF STRETCH FOR OB PAD LRG (UNDERPADS AND DIAPERS) ×2 IMPLANT
CANISTER SUCTION 2500CC (MISCELLANEOUS) ×2 IMPLANT
COVER SURGICAL LIGHT HANDLE (MISCELLANEOUS) ×2 IMPLANT
DRAPE LAPAROTOMY TRNSV 102X78 (DRAPE) ×1 IMPLANT
DRSG PAD ABDOMINAL 8X10 ST (GAUZE/BANDAGES/DRESSINGS) ×2 IMPLANT
ELECT CAUTERY BLADE 6.4 (BLADE) ×2 IMPLANT
GAUZE SPONGE 4X4 12PLY STRL (GAUZE/BANDAGES/DRESSINGS) ×2 IMPLANT
GLOVE BIO SURGEON STRL SZ 6 (GLOVE) ×2 IMPLANT
GLOVE BIOGEL PI IND STRL 6.5 (GLOVE) ×1 IMPLANT
GLOVE BIOGEL PI INDICATOR 6.5 (GLOVE) ×1
GOWN STRL REUS W/ TWL LRG LVL3 (GOWN DISPOSABLE) ×2 IMPLANT
GOWN STRL REUS W/TWL LRG LVL3 (GOWN DISPOSABLE) ×4
KIT BASIN OR (CUSTOM PROCEDURE TRAY) ×2 IMPLANT
KIT ROOM TURNOVER OR (KITS) ×2 IMPLANT
NDL HYPO 25GX1X1/2 BEV (NEEDLE) ×1 IMPLANT
NEEDLE HYPO 25GX1X1/2 BEV (NEEDLE) ×2 IMPLANT
NS IRRIG 1000ML POUR BTL (IV SOLUTION) ×2 IMPLANT
PACK GENERAL/GYN (CUSTOM PROCEDURE TRAY) ×1 IMPLANT
PACK LITHOTOMY IV (CUSTOM PROCEDURE TRAY) ×1 IMPLANT
PAD ABD 8X10 STRL (GAUZE/BANDAGES/DRESSINGS) ×1 IMPLANT
PAD ARMBOARD 7.5X6 YLW CONV (MISCELLANEOUS) ×2 IMPLANT
PENCIL BUTTON HOLSTER BLD 10FT (ELECTRODE) ×1 IMPLANT
SPONGE LAP 4X18 X RAY DECT (DISPOSABLE) ×1 IMPLANT
SURGILUBE 2OZ TUBE FLIPTOP (MISCELLANEOUS) ×2 IMPLANT
SUT CHROMIC 3 0 SH 27 (SUTURE) ×1 IMPLANT
SUT ETHILON 2 0 FS 18 (SUTURE) ×1 IMPLANT
SYR CONTROL 10ML LL (SYRINGE) ×2 IMPLANT
TOWEL OR 17X26 10 PK STRL BLUE (TOWEL DISPOSABLE) ×2 IMPLANT
TUBE CONNECTING 12X1/4 (SUCTIONS) ×2 IMPLANT
YANKAUER SUCT BULB TIP NO VENT (SUCTIONS) ×1 IMPLANT

## 2016-06-13 NOTE — H&P (Signed)
Surgical H&P  CC: buttock pain  HPI: 71yo gentleman who has had left gluteal pain for about a week. He initially noticed a small pustule but this has progressively worsened and enlarged to the point it feels like a mass within his left buttock. He notes recent issues with constipation due to using tylenol #3 for a recent left hip surgery. Denies pain with bowel movements. Denies fevers.   Allergies  Allergen Reactions  . Aspirin Hives    Past Medical History:  Diagnosis Date  . Adenomatous colon polyp   . GERD (gastroesophageal reflux disease)    never required medication to treat  . Hepatitis C   . Hypertension   . Osteoarthritis    plans left hip replacement   . Rheumatoid arthritis ([place])   . Vitiligo     Past Surgical History:  Procedure Laterality Date  . COLONOSCOPY  2014  . excision of nodule rt wrist -local anesthesia  yrs ago  . MULTIPLE EXTRACTIONS WITH ALVEOLOPLASTY N/A 06/22/2013   Procedure: Extraction of tooth #'s 3,4,5,8,9,10,12,21,22,23,27 with alveoloplasty, mandibular left torus reduction;  Surgeon: Lenn Cal, DDS;  Location: [facility];  Service: Oral Surgery;  Laterality: N/A;  . POLYPECTOMY    . TOTAL HIP ARTHROPLASTY Left 10/03/2013   Procedure: LEFT TOTAL HIP ARTHROPLASTY ANTERIOR APPROACH;  Surgeon: Mauri Pole, MD;  Location: [facility];  Service: Orthopedics;  Laterality: Left;    Family History  Problem Relation Age of Onset  . Diabetes Mother   . Hypertension Mother   . Diabetes Father   . Hypertension Father   . Diabetes Sister     Social History   Social History  . Marital status: Married    Spouse name: N/A  . Number of children: 4  . Years of education: N/A   Social History Main Topics  . Smoking status: Current Every Day Smoker    Packs/day: 0.50    Years: 35.00  . Smokeless tobacco: Never Used  . Alcohol use 2.4 oz/week    4 Cans of beer per week     Comment: rarely  . Drug use: Yes    Frequency: 1.0 time per week   Types: Cocaine     Comment: admits to cocaine use (would like to stop)  . Sexual activity: Yes    Partners: Female   Other Topics Concern  . None   Social History Narrative  . None    No current facility-administered medications on file prior to encounter.    Current Outpatient Prescriptions on File Prior to Encounter  Medication Sig Dispense Refill  . amLODipine (NORVASC) 5 MG tablet Take 5 mg by mouth every morning.    . hydrochlorothiazide (HYDRODIURIL) 25 MG tablet Take 25 mg by mouth every morning. Reported on 11/05/2015    . metoprolol (LOPRESSOR) 100 MG tablet Take 50 mg by mouth 2 (two) times daily. 1/2 tablet twice a day    . Ledipasvir-Sofosbuvir (HARVONI) 90-400 MG TABS Take 1 tablet by mouth daily. (Patient not taking: Reported on 06/13/2016) 28 tablet 2    Review of Systems: a complete, 10pt review of systems was completed with pertinent positives and negatives as documented in the HPI.   Physical Exam: Vitals:   06/13/16 1300 06/13/16 1410  BP: 152/63 179/90  Pulse: 83 96  Resp:    Temp:     Gen: A&Ox3, no distress  Head: normocephalic, atraumatic, EOMI, anicteric.  Neck: supple without mass or thyromegaly Chest: unlabored respirations, symmetrical air entry  Cardiovascular: RRR with palpable distal pulses Abdomen: soft, no mass or organomegaly, no tenderness Extremities: warm, without edema, no deformities  Neuro: grossly intact Psych: appropriate mood and affect, normal insight Skin: 5x10cm indurated tender area along medial posterior left gluteus with central 2cm area of skin necrosis beginning to weep purulent fluid. Minimal fluctuance   CBC Latest Ref Rng & Units 06/13/2016 06/27/2015 10/04/2013  WBC 4.0 - 10.5 K/uL 16.0(H) 4.8 9.9  Hemoglobin 13.0 - 17.0 g/dL 14.2 14.0 11.8(L)  Hematocrit 39.0 - 52.0 % 41.1 41.6 33.3(L)  Platelets 150 - 400 K/uL 262 322 184    CMP Latest Ref Rng & Units 06/13/2016 11/05/2015 06/27/2015  Glucose 65 - 99 mg/dL 137(H) 102(H)  112(H)  BUN 6 - 20 mg/dL 14 10 9   Creatinine 0.61 - 1.24 mg/dL 1.07 0.80 0.82  Sodium 135 - 145 mmol/L 132(L) 137 133(L)  Potassium 3.5 - 5.1 mmol/L 3.7 3.7 4.2  Chloride 101 - 111 mmol/L 92(L) 104 99  CO2 22 - 32 mmol/L 28 21 29   Calcium 8.9 - 10.3 mg/dL 9.4 9.1 9.1  Total Protein 6.5 - 8.1 g/dL 7.8 6.8 7.2  Total Bilirubin 0.3 - 1.2 mg/dL 1.7(H) 0.7 0.7  Alkaline Phos 38 - 126 U/L 75 60 72  AST 15 - 41 U/L 25 14 80(H)  ALT 17 - 63 U/L 12(L) 9 120(H)    Lab Results  Component Value Date   INR 1.05 06/27/2015   INR 1.06 09/27/2013    Imaging: CT pelvis demonstrates left gluteal soft tissue infection  A/P: 71yo man with left gluteal soft tissue infection. Will plan for debridement/exam under anesthesia today. I discussed the procedure in detail with him including possible drain placement vs packing, risk of bleeding, sepsis, need for repeat debridements, pain, scarring. He has not eaten at all today.  -IV abx -Admit to floor post-op -Possible discharge tomorrow on oral abx depending on post-op course   Romana Juniper, MD [facility], [place] Pager [phone]

## 2016-06-13 NOTE — ED Provider Notes (Signed)
[place] DEPT Provider Note   CSN: [id number] Arrival date & time: 06/13/16  0930     History   Chief Complaint Chief Complaint  Patient presents with  . Rectal Pain    HPI Jimmy Castaneda is a 71 y.o. male who presents with L buttock pain. PMH significant for HTN, Hep C, and precancerous colon polyps. Colonoscopy in 2013 showed internal hemorrhoids and diverticulosis without evidence of recurrent polyps. He states he engaged in oral sex with a "street worker" a couple of weeks ago. Denies anal sex. Last week he developed a "pimple" on the left buttock. He states he has been scratching the area and the swelling went down after a couple days however it the swelling came back and has worsened since yesterday. He has tried neosporin with no relief. Denies fever, chills, abdominal pain, N/V/D, rectal pain, rectal bleeding, drainage from the area. He has been constipated due to taking Tylenol #3.  HPI  Past Medical History:  Diagnosis Date  . Adenomatous colon polyp   . GERD (gastroesophageal reflux disease)    never required medication to treat  . Hepatitis C   . Hypertension   . Osteoarthritis    plans left hip replacement   . Rheumatoid arthritis ([place])   . Vitiligo     Patient Active Problem List   Diagnosis Date Noted  . Liver fibrosis ([place]) 11/05/2015  . Chronic hepatitis C without hepatic coma ([place]) 07/24/2015  . Expected blood loss anemia 10/04/2013  . S/P hip replacement 10/03/2013  . 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 COLONOSCOPY  2014  . excision of nodule rt wrist -local anesthesia  yrs ago  . MULTIPLE EXTRACTIONS WITH ALVEOLOPLASTY N/A 06/22/2013   Procedure: Extraction of tooth #'s 3,4,5,8,9,10,12,21,22,23,27 with alveoloplasty, mandibular left torus reduction;  Surgeon: Lenn Cal, DDS;  Location: [facility];  Service: Oral Surgery;  Laterality: N/A;  . POLYPECTOMY    . TOTAL HIP ARTHROPLASTY Left  10/03/2013   Procedure: LEFT TOTAL HIP ARTHROPLASTY ANTERIOR APPROACH;  Surgeon: Mauri Pole, MD;  Location: [facility];  Service: Orthopedics;  Laterality: Left;      Home Medications    Prior to Admission medications   Medication Sig Start Date End Date Taking? Authorizing Provider  amLODipine (NORVASC) 5 MG tablet Take 5 mg by mouth every morning.    Historical Provider, MD  hydrochlorothiazide (HYDRODIURIL) 25 MG tablet Take 25 mg by mouth every morning. Reported on 11/05/2015    Historical Provider, MD  Ledipasvir-Sofosbuvir (HARVONI) 90-400 MG TABS Take 1 tablet by mouth daily. 07/24/15   Thayer Headings, MD  metoprolol (LOPRESSOR) 100 MG tablet Take 50 mg by mouth 2 (two) times daily. 1/2 tablet twice a day    Historical Provider, MD  tacrolimus (PROTOPIC) 0.1 % ointment Apply 1 application topically 2 (two) times daily. Reported on 11/05/2015    Historical Provider, MD    Family History Family History  Problem Relation Age of Onset  . Diabetes Mother   . Hypertension Mother   . Diabetes Father   . Hypertension Father   . Diabetes Sister     Social History Social History  Substance Use Topics  . Smoking status: Current Every Day Smoker    Packs/day: 0.50    Years: 35.00  . Smokeless tobacco: Never Used  . Alcohol use 2.4 oz/week    4 Cans of beer per week     Comment:  rarely     Allergies   Aspirin   Review of Systems Review of Systems  Constitutional: Negative for chills and fever.  Gastrointestinal: Positive for constipation. Negative for abdominal pain, blood in stool, diarrhea, nausea, rectal pain and vomiting.  Skin: Positive for wound.       +Abscess     Physical Exam Updated Vital Signs BP 169/84 (BP Location: Right Arm)   Pulse 90   Temp 98.3 F (36.8 C) (Oral)   Resp 16   Ht 5\' 7"  (1.702 m)   Wt 73.5 kg   SpO2 98%   BMI 25.37 kg/m   Physical Exam  Constitutional: He is oriented to person, place, and time. He appears well-developed and  well-nourished. No distress.  HENT:  Head: Normocephalic and atraumatic.  Eyes: Conjunctivae are normal. Pupils are equal, round, and reactive to light. Right eye exhibits no discharge. Left eye exhibits no discharge. No scleral icterus.  Neck: Normal range of motion.  Cardiovascular: Normal rate.   Pulmonary/Chest: Effort normal. No respiratory distress.  Abdominal: Soft. Bowel sounds are normal. He exhibits no distension and no mass. There is no tenderness. There is no rebound and no guarding. No hernia.  Genitourinary:  Genitourinary Comments: Left buttocks: Indurated, erythematous skin with central area of necrosis over right buttocks area consistent with gluteal abscess. No fluctuance noted.   Rectal exam - no gross blood, hemorrhoids, fissures, redness, area of fluctuance, lesions, or tenderness. Chaperone present during exam.   Neurological: He is alert and oriented to person, place, and time.  Skin: Skin is warm and dry.  Psychiatric: He has a normal mood and affect. His behavior is normal.  Nursing note and vitals reviewed.    ED Treatments / Results  Labs (all labs ordered are listed, but only abnormal results are displayed) Labs Reviewed  COMPREHENSIVE METABOLIC PANEL - Abnormal; Notable for the following:       Result Value   Sodium 132 (*)    Chloride 92 (*)    Glucose, Bld 137 (*)    ALT 12 (*)    Total Bilirubin 1.7 (*)    All other components within normal limits  CBC WITH DIFFERENTIAL/PLATELET - Abnormal; Notable for the following:    WBC 16.0 (*)    RBC 4.14 (*)    MCH 34.3 (*)    Neutro Abs 13.0 (*)    Monocytes Absolute 1.9 (*)    All other components within normal limits    EKG  EKG Interpretation None       Radiology Ct Pelvis W Contrast  Result Date: 06/13/2016 CLINICAL DATA:  Possible abscess on left buttocks. EXAM: CT PELVIS WITH CONTRAST TECHNIQUE: Multidetector CT imaging of the pelvis was performed using the standard protocol following the  bolus administration of intravenous contrast. CONTRAST:  58mL ISOVUE-300 IOPAMIDOL (ISOVUE-300) INJECTION 61% COMPARISON:  None. FINDINGS: Urinary Tract: There is a probable cyst in the left kidney, incompletely characterized on today's study. No other renal or ureteral abnormalities. The bladder is unremarkable as well. Bowel:  Unremarkable visualized pelvic bowel loops. Vascular/Lymphatic: Atherosclerosis is seen in the visualized abdominal aorta, iliac vessels, and femoral vessels. No adenopathy. Reproductive: The prostate is enlarged measuring 6.7 by 6.8 cm in AP and transverse dimension. Other: Significant fat stranding in the left buttocks most prominent just to the left of the gluteal crease. There is increased density as seen on series 201, image 54 and coronal image 89 measuring 4.6 by 3.2 by 4.3 cm. There is  interspersed fat attenuation. The overall attenuation is 20 Hounsfield units. Overlying skin thickening is identified. No soft tissue gas is noted. No involvement of the underlying muscle is noted. Musculoskeletal: The patient is status post left hip replacement. No acute bony abnormalities. 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 IMPRESSION: 1. Fat stranding and skin thickening in the left buttocks with an ill defined region of rounded increased attenuation just to the left of the gluteal fold. The ill defined rounded increased attenuation is consistent with a phlegmon/developing abscess. The interspersed fat attenuation and lack of peripheral enhancement suggests there may not be drainable fluid at this time. An ultrasound could better assess for drainable fluid if clinically warranted. 2. Atherosclerosis. 3. Enlarged prostate. Electronically Signed   By: Dorise Bullion III M.D   On: 06/13/2016 13:00    Procedures Procedures (including critical care time)  Medications Ordered in ED Medications  enoxaparin (LOVENOX) injection 40 mg ( Subcutaneous Automatically Held 06/21/16 2000)  0.9 %   sodium chloride infusion (not administered)  acetaminophen (TYLENOL) tablet 650 mg ( Oral MAR Hold 06/13/16 1516)    Or  acetaminophen (TYLENOL) suppository 650 mg ( Rectal MAR Hold 06/13/16 1516)  ibuprofen (ADVIL,MOTRIN) tablet 600 mg ( Oral MAR Hold 06/13/16 1516)  HYDROmorphone (DILAUDID) injection 0.5 mg ( Intravenous MAR Hold 06/13/16 1516)  diphenhydrAMINE (BENADRYL) 12.5 MG/5ML elixir 12.5 mg ( Oral MAR Hold 06/13/16 1516)    Or  diphenhydrAMINE (BENADRYL) injection 12.5 mg ( Intravenous MAR Hold 06/13/16 1516)  docusate sodium (COLACE) capsule 100 mg ( Oral Automatically Held 06/21/16 2200)  polyethylene glycol (MIRALAX / GLYCOLAX) packet 17 g ( Oral MAR Hold 06/13/16 1516)  bisacodyl (DULCOLAX) suppository 10 mg ( Rectal MAR Hold 06/13/16 1516)  ondansetron (ZOFRAN-ODT) disintegrating tablet 4 mg ( Oral MAR Hold 06/13/16 1516)    Or  ondansetron (ZOFRAN) injection 4 mg ( Intravenous MAR Hold 06/13/16 1516)  traMADol (ULTRAM) tablet 50 mg ( Oral MAR Hold 06/13/16 1516)  piperacillin-tazobactam (ZOSYN) IVPB 3.375 g ( Intravenous MAR Hold 06/13/16 1516)  piperacillin-tazobactam (ZOSYN) IVPB 3.375 g ( Intravenous Automatically Held 06/21/16 2200)  vancomycin (VANCOCIN) 1,500 mg in sodium chloride 0.9 % 500 mL IVPB ( Intravenous MAR Hold 06/13/16 1516)  0.9 % irrigation (POUR BTL) (1,000 mLs Irrigation Given 06/13/16 1549)  morphine 4 MG/ML injection 4 mg (4 mg Intravenous Given 06/13/16 1050)  iopamidol (ISOVUE-300) 61 % injection (75 mLs  Contrast Given 06/13/16 1228)  sodium chloride 0.9 % bolus 1,000 mL (0 mLs Intravenous Stopped 06/13/16 1416)  morphine 4 MG/ML injection 4 mg (4 mg Intravenous Given 06/13/16 1211)  metroNIDAZOLE (FLAGYL) IVPB 500 mg (500 mg Intravenous Transfusing/Transfer 06/13/16 1452)     Initial Impression / Assessment and Plan / ED Course  I have reviewed the triage vital signs and the nursing notes.  Pertinent labs & imaging results that were available during my care of the patient were  reviewed by me and considered in my medical decision making (see chart for details).  71 year old male presents with L gluteal abscess. He is hypertensive but otherwise vitals are normal. CBC remarkable for leukocytosis (16.0). CMP remarkable for mild hyponatremia and hypochloremia. CT of pelvis is remarkable for developing abscess with stranding. Pain controlled in ED. Vanc and Flagyl started. Shared visit with Dr. Leim Fabry with Dr. Kae Heller who will admit patient for I&D in the OR. Appreciate assistance.   Final Clinical Impressions(s) / ED Diagnoses   Final diagnoses:  Abscess, gluteal, left  New Prescriptions New Prescriptions   No medications on file     Recardo Evangelist, PA-C 06/13/16 [id number]

## 2016-06-13 NOTE — ED Notes (Signed)
Pt ambulatory with ease to the room.

## 2016-06-13 NOTE — Anesthesia Postprocedure Evaluation (Signed)
Anesthesia Post Note  Patient: Jimmy Castaneda  Procedure(s) Performed: Procedure(s) (LRB): IRRIGATION AND DEBRIDEMENT OF GLUTAL ABSCESS (Left)  Patient location during evaluation: PACU Anesthesia Type: General Level of consciousness: sedated and patient cooperative Pain management: pain level controlled Vital Signs Assessment: post-procedure vital signs reviewed and stable Respiratory status: spontaneous breathing Cardiovascular status: stable Anesthetic complications: no       Last Vitals:  Vitals:   06/13/16 1708 06/13/16 1723  BP: (!) 177/90 (!) 156/71  Pulse: (!) 103 (!) 115  Resp: (!) 21 (!) 22  Temp: 37.7 C 37.7 C    Last Pain:  Vitals:   06/13/16 1730  TempSrc:   PainSc: 0-No pain                 Nolon Nations

## 2016-06-13 NOTE — ED Notes (Signed)
Patient transported to CT 

## 2016-06-13 NOTE — Anesthesia Procedure Notes (Signed)
Procedure Name: Intubation Date/Time: 06/13/2016 3:47 PM Performed by: Claris Che Pre-anesthesia Checklist: Patient identified, Emergency Drugs available, Suction available, Patient being monitored and Timeout performed Patient Re-evaluated:Patient Re-evaluated prior to inductionOxygen Delivery Method: Circle system utilized Preoxygenation: Pre-oxygenation with 100% oxygen Intubation Type: IV induction, Rapid sequence and Cricoid Pressure applied Laryngoscope Size: Mac and 3 Grade View: Grade I Tube size: 7.5 mm Number of attempts: 1 Airway Equipment and Method: Stylet Placement Confirmation: ETT inserted through vocal cords under direct vision,  positive ETCO2 and breath sounds checked- equal and bilateral Secured at: 23 cm Tube secured with: Tape Dental Injury: Teeth and Oropharynx as per pre-operative assessment

## 2016-06-13 NOTE — Progress Notes (Signed)
3 bags of belongings and dentures taken to PACU

## 2016-06-13 NOTE — ED Triage Notes (Signed)
Pt c/o anal pain x 1 week. Pt reports that he had a pimple on his buttock area that he opened but now there is a hard spot inside his anal area.

## 2016-06-13 NOTE — ED Provider Notes (Signed)
Medical screening examination/treatment/procedure(s) were conducted as a shared visit with non-physician practitioner(s) and myself.  I personally evaluated the patient during the encounter.   EKG Interpretation None     Patient here complaining of left gluteal fold swelling and tenderness 1 week. CT scan shows deep infection. Will consult surgery   Lacretia Leigh, MD 06/13/16 1352

## 2016-06-13 NOTE — Anesthesia Preprocedure Evaluation (Signed)
Anesthesia Evaluation  Patient identified by MRN, date of birth, ID band Patient awake    Reviewed: Allergy & Precautions, H&P , NPO status , Patient's Chart, lab work & pertinent test results, reviewed documented beta blocker date and time   Airway Mallampati: II  TM Distance: >3 FB Neck ROM: Full    Dental  (+) Poor Dentition, Chipped, Missing, Dental Advisory Given   Pulmonary Current Smoker, former smoker,    Pulmonary exam normal breath sounds clear to auscultation       Cardiovascular hypertension, Pt. on medications and Pt. on home beta blockers Normal cardiovascular exam Rhythm:Regular Rate:Normal     Neuro/Psych negative neurological ROS  negative psych ROS   GI/Hepatic GERD  ,(+) Hepatitis -, C  Endo/Other  negative endocrine ROS  Renal/GU negative Renal ROS     Musculoskeletal  (+) Arthritis , Rheumatoid disorders,    Abdominal   Peds  Hematology  (+) anemia ,   Anesthesia Other Findings   Reproductive/Obstetrics                             Anesthesia Physical  Anesthesia Plan  ASA: III  Anesthesia Plan: General   Post-op Pain Management:    Induction: Intravenous  Airway Management Planned: LMA and Oral ETT  Additional Equipment:   Intra-op Plan:   Post-operative Plan: Extubation in OR  Informed Consent: I have reviewed the patients History and Physical, chart, labs and discussed the procedure including the risks, benefits and alternatives for the proposed anesthesia with the patient or authorized representative who has indicated his/her understanding and acceptance.   Dental advisory given  Plan Discussed with: CRNA  Anesthesia Plan Comments:         Anesthesia Quick Evaluation

## 2016-06-13 NOTE — Progress Notes (Signed)
Pharmacy Antibiotic Note  Jimmy Castaneda is a 71 y.o. male admitted on 06/13/2016 with cellulitis.  Pharmacy has been consulted for Zosyn dosing.  Had left gluteal pain for about a week. Initially noted small pustule but has progressively worsened. Afebrile, WBC 16. SCr stable, CrCL ~82ml/min. Also given vanc and Flagyl x 1 in the ED.  Plan: Start Zosyn 3.375 gm IV q8h (4 hour infusion) Monitor clinical picture, renal function F/U C&S, abx deescalation / LOT  Need for continued MRSA coverage?   Height: 5\' 7"  (170.2 cm) Weight: 162 lb (73.5 kg) IBW/kg (Calculated) : 66.1  Temp (24hrs), Avg:98.3 F (36.8 C), Min:98.3 F (36.8 C), Max:98.3 F (36.8 C)   Recent Labs Lab 06/13/16 1044  WBC 16.0*  CREATININE 1.07    Estimated Creatinine Clearance: 60.1 mL/min (by C-G formula based on SCr of 1.07 mg/dL).    Allergies  Allergen Reactions  . Aspirin Hives    Antimicrobials this admission: Vancomycin 2/3 x 1 Zosyn 2/3 >>  Flagyl 2/3 x 1  Dose adjustments this admission: n/a  Microbiology results: n/a  Thank you for allowing pharmacy to be a part of this patient's care.  Reginia Naas 06/13/2016 2:52 PM

## 2016-06-13 NOTE — Transfer of Care (Signed)
Immediate Anesthesia Transfer of Care Note  Patient: Jimmy Castaneda  Procedure(s) Performed: Procedure(s): IRRIGATION AND DEBRIDEMENT OF GLUTAL ABSCESS (Left)  Patient Location: PACU  Anesthesia Type:General  Level of Consciousness: sedated, patient cooperative and responds to stimulation  Airway & Oxygen Therapy: Patient Spontanous Breathing and Patient connected to nasal cannula oxygen  Post-op Assessment: Report given to RN, Post -op Vital signs reviewed and stable and Patient moving all extremities X 4  Post vital signs: Reviewed and stable  Last Vitals:  Vitals:   06/13/16 1410 06/13/16 1445  BP: 179/90   Pulse: 96 84  Resp:    Temp:      Last Pain:  Vitals:   06/13/16 1452  TempSrc:   PainSc: 9          Complications: No apparent anesthesia complications

## 2016-06-13 NOTE — Op Note (Addendum)
Operative Note  JALIEL WINKEL  [id number]  [id number]  06/13/2016   Surgeon: Clovis Riley  Assistant: none  Procedure performed: Incision and debridement gluteal abscess  Preop diagnosis: left gluteal abscess Post-op diagnosis/intraop findings: Large left gluteal abscess, cavity extends 10cm inferiorly and about 5cm medially, no violation of fascia  Specimens: cultures obtained  Retained items: half-inch penrose, kerlix EBL: 5cc Complications: none  Description of procedure: After obtaining informed consent the patient was taken to the operating room and placed supine on operating room table wheregeneral endotracheal anesthesia was initiated, preoperative antibiotics were administered, SCDs applied, and a formal timeout was performed. He was placed in the right lateral decubitus position and all pressure points padded appropriately. The buttocks was prepped and draped with betadine. The eschar on the superomedial gluteus which had begun to weep pus was incised sharply and the wound probed digitally expressing multiple loculations of thick purulent fluid. The abscess tracked infero/anteriorly toward the anus and across the midline a short distance. Once all loculations had been expressed the wound was irrigated with sterile saline and hemostasis ensured. A counterincision was made inferiorly in the left gluteus and a penrose was threaded through the wound and sutured to itself with 2-0 nylon. The wound was then packed with a saline moistened kerlix and covered with dry dressings. He was returned supine and then awakened, extubated and taken to PACU in stable condition.   All counts were correct at the completion of the case.

## 2016-06-14 ENCOUNTER — Encounter (HOSPITAL_COMMUNITY): Payer: Self-pay | Admitting: Surgery

## 2016-06-14 DIAGNOSIS — L0231 Cutaneous abscess of buttock: Secondary | ICD-10-CM | POA: Diagnosis not present

## 2016-06-14 LAB — CBC
HCT: 35.2 % — ABNORMAL LOW (ref 39.0–52.0)
Hemoglobin: 11.8 g/dL — ABNORMAL LOW (ref 13.0–17.0)
MCH: 33.5 pg (ref 26.0–34.0)
MCHC: 33.5 g/dL (ref 30.0–36.0)
MCV: 100 fL (ref 78.0–100.0)
PLATELETS: 238 10*3/uL (ref 150–400)
RBC: 3.52 MIL/uL — ABNORMAL LOW (ref 4.22–5.81)
RDW: 12.7 % (ref 11.5–15.5)
WBC: 17.4 10*3/uL — ABNORMAL HIGH (ref 4.0–10.5)

## 2016-06-14 NOTE — Progress Notes (Signed)
1 Day Post-Op I&D gluteal abscess Subjective: Having no further pain.  Developed urinary retention overnight and foley inserted.    Objective: Vital signs in last 24 hours: Temp:  [99.1 F (37.3 C)-100.5 F (38.1 C)] 99.1 F (37.3 C) (02/04 0914) Pulse Rate:  [77-120] 89 (02/04 0914) Resp:  [18-22] 18 (02/04 0914) BP: (129-182)/(60-97) 137/61 (02/04 0914) SpO2:  [91 %-100 %] 98 % (02/04 0914) Weight:  [72.3 kg (159 lb 8 oz)] 72.3 kg (159 lb 8 oz) (02/03 1656)   Intake/Output from previous day: 02/03 0701 - 02/04 0700 In: 2028.3 [P.O.:240; I.V.:738.3; IV Piggyback:1050] Out: 2102 [Urine:2100; Blood:2] Intake/Output this shift: Total I/O In: 700 [P.O.:600; Other:100] Out: 1000 [Urine:1000]   General appearance: alert and cooperative GI: soft, nontender  Incision: packing removed.  Penrose in place  Lab Results:   Recent Labs  06/13/16 1044 06/14/16 0409  WBC 16.0* 17.4*  HGB 14.2 11.8*  HCT 41.1 35.2*  PLT 262 238   BMET  Recent Labs  06/13/16 1044  NA 132*  K 3.7  CL 92*  CO2 28  GLUCOSE 137*  BUN 14  CREATININE 1.07  CALCIUM 9.4   PT/INR No results for input(s): LABPROT, INR in the last 72 hours. ABG No results for input(s): PHART, HCO3 in the last 72 hours.  Invalid input(s): PCO2, PO2  MEDS, Scheduled . amLODipine  5 mg Oral q morning - 10a  . docusate sodium  100 mg Oral BID  . enoxaparin (LOVENOX) injection  40 mg Subcutaneous Q24H  . metoprolol  50 mg Oral BID  . piperacillin-tazobactam (ZOSYN)  IV  3.375 g Intravenous Q8H    Studies/Results: Ct Pelvis W Contrast  Result Date: 06/13/2016 CLINICAL DATA:  Possible abscess on left buttocks. EXAM: CT PELVIS WITH CONTRAST TECHNIQUE: Multidetector CT imaging of the pelvis was performed using the standard protocol following the bolus administration of intravenous contrast. CONTRAST:  28mL ISOVUE-300 IOPAMIDOL (ISOVUE-300) INJECTION 61% COMPARISON:  None. FINDINGS: Urinary Tract: There is a  probable cyst in the left kidney, incompletely characterized on today's study. No other renal or ureteral abnormalities. The bladder is unremarkable as well. Bowel:  Unremarkable visualized pelvic bowel loops. Vascular/Lymphatic: Atherosclerosis is seen in the visualized abdominal aorta, iliac vessels, and femoral vessels. No adenopathy. Reproductive: The prostate is enlarged measuring 6.7 by 6.8 cm in AP and transverse dimension. Other: Significant fat stranding in the left buttocks most prominent just to the left of the gluteal crease. There is increased density as seen on series 201, image 54 and coronal image 89 measuring 4.6 by 3.2 by 4.3 cm. There is interspersed fat attenuation. The overall attenuation is 20 Hounsfield units. Overlying skin thickening is identified. No soft tissue gas is noted. No involvement of the underlying muscle is noted. Musculoskeletal: The patient is status post left hip replacement. No acute bony abnormalities. 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 IMPRESSION: 1. Fat stranding and skin thickening in the left buttocks with an ill defined region of rounded increased attenuation just to the left of the gluteal fold. The ill defined rounded increased attenuation is consistent with a phlegmon/developing abscess. The interspersed fat attenuation and lack of peripheral enhancement suggests there may not be drainable fluid at this time. An ultrasound could better assess for drainable fluid if clinically warranted. 2. Atherosclerosis. 3. Enlarged prostate. Electronically Signed   By: Dorise Bullion III M.D   On: 06/13/2016 13:00    Assessment: s/p Procedure(s): IRRIGATION AND DEBRIDEMENT OF [place] ABSCESS Patient Active Problem  List   Diagnosis Date Noted  . Gluteal abscess 06/13/2016  . Liver fibrosis ([place]) 11/05/2015  . Chronic hepatitis C without hepatic coma ([place]) 07/24/2015  . Expected blood loss anemia 10/04/2013  . S/P hip replacement 10/03/2013  . S/P left THA, AA  06/02/2013      Plan: Advance diet  Sitz baths TID.  No further packing needed Urinary retention: will d/c in AM and do voiding trial Hopefully d/c tom or tues Nor further abx needed from our standpoint    LOS: 0 days     .Rosario Adie, [facility], [place]   06/14/2016 9:47 AM

## 2016-06-14 NOTE — Progress Notes (Signed)
Pt trying to urinate but unsuccessful, did the bladder scan shows 921 ml, informed on call MD Kennith Center and she ordered  Insert foley cath and the output was 850 ml.

## 2016-06-15 DIAGNOSIS — L0231 Cutaneous abscess of buttock: Secondary | ICD-10-CM | POA: Diagnosis not present

## 2016-06-15 LAB — COMPREHENSIVE METABOLIC PANEL
ALBUMIN: 2.8 g/dL — AB (ref 3.5–5.0)
ALK PHOS: 60 U/L (ref 38–126)
ALT: 14 U/L — AB (ref 17–63)
ANION GAP: 8 (ref 5–15)
AST: 26 U/L (ref 15–41)
BILIRUBIN TOTAL: 1 mg/dL (ref 0.3–1.2)
BUN: 7 mg/dL (ref 6–20)
CALCIUM: 8.5 mg/dL — AB (ref 8.9–10.3)
CO2: 26 mmol/L (ref 22–32)
CREATININE: 0.87 mg/dL (ref 0.61–1.24)
Chloride: 98 mmol/L — ABNORMAL LOW (ref 101–111)
GFR calc Af Amer: >60 mL/min (ref >60)
GFR calc non Af Amer: >60 mL/min (ref >60)
GLUCOSE: 138 mg/dL — AB (ref 65–99)
Potassium: 3.3 mmol/L — ABNORMAL LOW (ref 3.5–5.1)
Sodium: 132 mmol/L — ABNORMAL LOW (ref 135–145)
TOTAL PROTEIN: 6.5 g/dL (ref 6.5–8.1)

## 2016-06-15 LAB — CBC
HEMATOCRIT: 34 % — AB (ref 39.0–52.0)
HEMOGLOBIN: 11.6 g/dL — AB (ref 13.0–17.0)
MCH: 33.8 pg (ref 26.0–34.0)
MCHC: 34.1 g/dL (ref 30.0–36.0)
MCV: 99.1 fL (ref 78.0–100.0)
Platelets: 234 10*3/uL (ref 150–400)
RBC: 3.43 MIL/uL — AB (ref 4.22–5.81)
RDW: 12.4 % (ref 11.5–15.5)
WBC: 13.3 10*3/uL — ABNORMAL HIGH (ref 4.0–10.5)

## 2016-06-15 MED ORDER — TRAMADOL HCL 50 MG PO TABS: 50.0000 mg | ORAL_TABLET | Freq: Four times a day (QID) | ORAL | 0 refills | Status: AC | PRN

## 2016-06-15 MED ORDER — AMOXICILLIN-POT CLAVULANATE 875-125 MG PO TABS: 1.0000 | ORAL_TABLET | Freq: Two times a day (BID) | ORAL | 0 refills | Status: AC

## 2016-06-15 MED ORDER — POTASSIUM CHLORIDE CRYS ER 20 MEQ PO TBCR
20.0000 meq | EXTENDED_RELEASE_TABLET | Freq: Two times a day (BID) | ORAL | Status: DC
  Administered 2016-06-15: 20 meq via ORAL
  Filled 2016-06-15: qty 1

## 2016-06-15 MED ORDER — AMOXICILLIN-POT CLAVULANATE 875-125 MG PO TABS
1.0000 | ORAL_TABLET | Freq: Two times a day (BID) | ORAL | Status: DC
  Administered 2016-06-15: 1 via ORAL
  Filled 2016-06-15: qty 1

## 2016-06-15 MED ORDER — TAMSULOSIN HCL 0.4 MG PO CAPS
0.4000 mg | ORAL_CAPSULE | Freq: Every day | ORAL | Status: DC
  Administered 2016-06-15: 0.4 mg via ORAL
  Filled 2016-06-15: qty 1

## 2016-06-15 NOTE — Care Management Obs Status (Signed)
[facility] NOTIFICATION   Patient Details  Name: Jimmy Castaneda MRN: [id number] Date of Birth: 1946/02/20   Medicare Observation Status Notification Given:  Yes    Marilu Favre, RN 06/15/2016, 3:40 PM

## 2016-06-15 NOTE — Progress Notes (Signed)
[facility] Progress Note  2 Days Post-Op  Subjective: Pain is improved. No new complaints  Objective: Vital signs in last 24 hours: Temp:  [97.5 F (36.4 C)-100.2 F (37.9 C)] 97.5 F (36.4 C) (02/05 0500) Pulse Rate:  [52-89] 59 (02/05 0500) Resp:  [17-18] 18 (02/05 0500) BP: (127-145)/(61-88) 145/88 (02/05 0500) SpO2:  [98 %-100 %] 100 % (02/05 0500) Last BM Date: 06/10/16 (approximately)  Intake/Output from previous day: 02/04 0701 - 02/05 0700 In: 3545.3 [P.O.:1540; I.V.:1855.3] Out: 2425 [Urine:2425] Intake/Output this shift: No intake/output data recorded.  PE: Gen:  Alert, NAD, pleasant, cooperative, lying in bed Card:  RRR, systolic murmur noted Pulm:  CTA anteriorly, no W/R/R, rate and effort normal Abd: Skin: no rashes noted, warm and dry, wound to left buttocks with penrose drain, minimal drainage noted, induration noted around wound, minimal surrounding erythema   Lab Results:   Recent Labs  06/13/16 1044 06/14/16 0409  WBC 16.0* 17.4*  HGB 14.2 11.8*  HCT 41.1 35.2*  PLT 262 238   BMET  Recent Labs  06/13/16 1044  NA 132*  K 3.7  CL 92*  CO2 28  GLUCOSE 137*  BUN 14  CREATININE 1.07  CALCIUM 9.4   PT/INR No results for input(s): LABPROT, INR in the last 72 hours. CMP     Component Value Date/Time   NA 132 (L) 06/13/2016 1044   K 3.7 06/13/2016 1044   CL 92 (L) 06/13/2016 1044   CO2 28 06/13/2016 1044   GLUCOSE 137 (H) 06/13/2016 1044   BUN 14 06/13/2016 1044   CREATININE 1.07 06/13/2016 1044   CREATININE 0.80 11/05/2015 1148   CALCIUM 9.4 06/13/2016 1044   PROT 7.8 06/13/2016 1044   ALBUMIN 3.7 06/13/2016 1044   AST 25 06/13/2016 1044   ALT 12 (L) 06/13/2016 1044   ALKPHOS 75 06/13/2016 1044   BILITOT 1.7 (H) 06/13/2016 1044   GFRNONAA >60 06/13/2016 1044   GFRNONAA >89 11/05/2015 1148   GFRAA >60 06/13/2016 1044   GFRAA >89 11/05/2015 1148   Lipase  No results found for: LIPASE     Studies/Results: Ct  Pelvis W Contrast  Result Date: 06/13/2016 CLINICAL DATA:  Possible abscess on left buttocks. EXAM: CT PELVIS WITH CONTRAST TECHNIQUE: Multidetector CT imaging of the pelvis was performed using the standard protocol following the bolus administration of intravenous contrast. CONTRAST:  46mL ISOVUE-300 IOPAMIDOL (ISOVUE-300) INJECTION 61% COMPARISON:  None. FINDINGS: Urinary Tract: There is a probable cyst in the left kidney, incompletely characterized on today's study. No other renal or ureteral abnormalities. The bladder is unremarkable as well. Bowel:  Unremarkable visualized pelvic bowel loops. Vascular/Lymphatic: Atherosclerosis is seen in the visualized abdominal aorta, iliac vessels, and femoral vessels. No adenopathy. Reproductive: The prostate is enlarged measuring 6.7 by 6.8 cm in AP and transverse dimension. Other: Significant fat stranding in the left buttocks most prominent just to the left of the gluteal crease. There is increased density as seen on series 201, image 54 and coronal image 89 measuring 4.6 by 3.2 by 4.3 cm. There is interspersed fat attenuation. The overall attenuation is 20 Hounsfield units. Overlying skin thickening is identified. No soft tissue gas is noted. No involvement of the underlying muscle is noted. Musculoskeletal: The patient is status post left hip replacement. No acute bony abnormalities. 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 IMPRESSION: 1. Fat stranding and skin thickening in the left buttocks with an ill defined region of rounded increased attenuation just to the left of  the gluteal fold. The ill defined rounded increased attenuation is consistent with a phlegmon/developing abscess. The interspersed fat attenuation and lack of peripheral enhancement suggests there may not be drainable fluid at this time. An ultrasound could better assess for drainable fluid if clinically warranted. 2. Atherosclerosis. 3. Enlarged prostate. Electronically Signed   By: Dorise Bullion  III M.D   On: 06/13/2016 13:00    Anti-infectives: Anti-infectives    Start     Dose/Rate Route Frequency Ordered Stop   06/13/16 2200  piperacillin-tazobactam (ZOSYN) IVPB 3.375 g  Status:  Discontinued     3.375 g 12.5 mL/hr over 240 Minutes Intravenous Every 8 hours 06/13/16 1451 06/14/16 0949   06/13/16 1500  piperacillin-tazobactam (ZOSYN) IVPB 3.375 g  Status:  Discontinued     3.375 g 100 mL/hr over 30 Minutes Intravenous  Once 06/13/16 1451 06/14/16 0917   06/13/16 1500  vancomycin (VANCOCIN) 1,500 mg in sodium chloride 0.9 % 500 mL IVPB     1,500 mg 250 mL/hr over 120 Minutes Intravenous  Once 06/13/16 1453 06/13/16 1610   06/13/16 1400  vancomycin (VANCOCIN) IVPB 1000 mg/200 mL premix  Status:  Discontinued     1,000 mg 200 mL/hr over 60 Minutes Intravenous  Once 06/13/16 1349 06/13/16 1453   06/13/16 1400  metroNIDAZOLE (FLAGYL) IVPB 500 mg     500 mg 100 mL/hr over 60 Minutes Intravenous  Once 06/13/16 1355 06/13/16 1515       Assessment/Plan   Patient Active Problem List   Diagnosis Date Noted  . Gluteal abscess 06/13/2016  . Liver fibrosis ([place]) 11/05/2015  . Chronic hepatitis C without hepatic coma ([place]) 07/24/2015  . Expected blood loss anemia 10/04/2013  . S/P hip replacement 10/03/2013  . S/P left THA, AA 06/02/2013    s/p Procedure(s): IRRIGATION AND DEBRIDEMENT OF GLUTAL ABSCESS 06/13/16, Dr. Kae Heller, POD#2   Plan: Advance diet  Sitz baths TID.  No further packing needed Hopefully d/c today or tues pending morning labs Needs one week PO Augmentin at discharge  Pending AM labs, will check CBC. If ok will discharge today.   LOS: 0 days    Kalman Drape , [facility] 06/15/2016, 8:49 AM Pager: [phone] Consults: [phone] Mon-Fri 7:00 am-4:30 pm Sat-Sun 7:00 am-11:30 am

## 2016-06-15 NOTE — Progress Notes (Signed)
Wasted 73mL dilaudid with Esperanza Richters, RN.

## 2016-06-15 NOTE — Discharge Summary (Signed)
[facility] Discharge Summary   Patient ID: Jimmy Castaneda MRN: [id number] DOB/AGE: 1945/07/29 72 y.o.  Admit date: 06/13/2016 Discharge date: 06/15/2016  Admitting Diagnosis: Left gluteal abscess  Discharge Diagnosis Patient Active Problem List   Diagnosis Date Noted  . Gluteal abscess 06/13/2016  . Liver fibrosis ([place]) 11/05/2015  . Chronic hepatitis C without hepatic coma ([place]) 07/24/2015  . Expected blood loss anemia 10/04/2013  . S/P hip replacement 10/03/2013  . S/P left THA, AA 06/02/2013    Consultants None  Imaging: No results found.  Procedures Dr. Windle Guard, (06/13/2016), incision and debridement gluteal abscess with placement of a Penrose drain   Hospital Course:  Jimmy Castaneda is a 71 year old male who presented to [facility] with left gluteal pain for 1 week.  Workup showed left gluteal soft tissue infection with abscess.  Patient was admitted and underwent procedure listed above.  Tolerated procedure well and was transferred to the floor.  Diet was advanced as tolerated.  On POD#1 patient developed urinary retention and Foley was inserted. On POD #2 Foley was removed and patient was able to void on his own. On day #2 the patient was voiding well, tolerating diet, ambulating well, pain well controlled, vital signs stable, wound with minimal drainage or pain, and felt stable for discharge home.  Patient will follow up in our office in 1 weeks to see Dr. Windle Guard and knows to call with questions or concerns.  He will call to confirm appointment date/time.  Patient was discharged in good condition.    Allergies as of 06/15/2016      Reactions   Aspirin Hives      Medication List    TAKE these medications   amLODipine 5 MG tablet Commonly known as:  NORVASC Take 5 mg by mouth every morning.   amoxicillin-clavulanate 875-125 MG tablet Commonly known as:  AUGMENTIN Take 1 tablet by mouth every 12 (twelve) hours.   hydrochlorothiazide 25 MG  tablet Commonly known as:  HYDRODIURIL Take 25 mg by mouth every morning. Reported on 11/05/2015   Ledipasvir-Sofosbuvir 90-400 MG Tabs Commonly known as:  HARVONI Take 1 tablet by mouth daily.   metoprolol 100 MG tablet Commonly known as:  LOPRESSOR Take 50 mg by mouth 2 (two) times daily. 1/2 tablet twice a day   traMADol 50 MG tablet Commonly known as:  ULTRAM Take 1 tablet (50 mg total) by mouth every 6 (six) hours as needed for severe pain.        Follow-up Information    Clovis Riley, MD. Schedule an appointment as soon as possible for a visit in 1 week(s).   Specialty:  General Surgery Contact information: [phone]           Signed: [facility] 06/15/2016, 2:44 PM Pager: [phone] Consults: [phone] Mon-Fri 7:00 am-4:30 pm Sat-Sun 7:00 am-11:30 am

## 2016-06-15 NOTE — Progress Notes (Signed)
Pt.'s belongings gathered from security and given to pt.

## 2016-06-15 NOTE — Progress Notes (Signed)
NOtified MD on call about infection prevention and that pt. Has MRSA and should have been on contact. Pt. Already d/c'd and does not change anything about d/c.

## 2016-06-15 NOTE — Progress Notes (Signed)
Pt offer to use the Sitz bath pm shift but he refused , he said he will do that in am shift, given dilaudid and relieved from pain than tramadol.

## 2016-06-15 NOTE — Progress Notes (Signed)
Pt. Doing sitz bath.  

## 2016-06-15 NOTE — Progress Notes (Signed)
Pt. Voided 150 and had big BM with some unmeasured urine in toilet. PA Jessica notified.

## 2016-06-15 NOTE — Progress Notes (Addendum)
  D/C papers gone over with pt. NO questions/complaints. IV's taken out. Prescriptions given to pt. Pt. Says his wallet and everything is in the packet and he has no complaints about his belongings. He's not missing anything. Pt. D/c'd successfully.

## 2016-06-15 NOTE — Progress Notes (Signed)
Foley taken out. Pt. Tolerated well. Due to void by 1900 on 06/15/16.

## 2016-06-15 NOTE — Discharge Instructions (Signed)
Percutaneous Abscess Drain, Care After Refer to this sheet in the next few weeks. These instructions provide you with information on caring for yourself after your procedure. Your treatment has been planned according to current medical practices, but problems sometimes occur. Call your health care provider if you have any problems or questions after your procedure.  Continue to do sitz bathes twice a day. Change the dressing twice a day or more as needed depending on the drainage and how soaked your bandage is. Take the antibiotics as prescribed. Call our office immediately if you experience fever, worsening pain, swelling, increased discharge or any other concerning symptoms.  WHAT TO EXPECT AFTER THE PROCEDURE After your procedure, it is typical to have the following:   A small amount of discomfort in the area where the drainage tube was placed.  A small amount of bruising around the area where the drainage tube was placed.  Sleepiness and fatigue for the rest of the day from the medicines used. HOME CARE INSTRUCTIONS  Rest at home for 1-2 days following your procedure or as directed by your health care provider.  If you go home right after the procedure, plan to have someone with you for 24 hours.  Take medicines only as directed by your health care provider. Ask your health care provider when you can resume taking any normal medicines.  Change bandages (dressings) as directed.   You may be told to record the amount of drainage from the bag every time you empty it. Follow your health care provider's directions for emptying the bag. Write down the amount of drainage, the date, and the time you emptied it.  SEEK MEDICAL CARE IF:  You have increased bleeding (more than a small spot) from the site where the drainage tube was placed.  You have redness, swelling, or increasing pain around the site where the drainage tube was placed.  You notice a discharge or bad smell coming from the  site where the drainage tube was placed.  You have a fever or chills.  You have pain that is not helped by medicine.  SEEK IMMEDIATE MEDICAL CARE IF:  There is leakage around the drainage tube.  The drainage tube pulls out.  You suddenly stop having drainage from the tube.  You suddenly have blood in the drainage fluid.  You become dizzy or faint.  You develop a rash.   You have nausea or vomiting.  You have difficulty breathing, feel short of breath, or feel faint.   You develop chest pain.  You have problems with your speech or vision.  You have trouble balancing or moving your arms or legs. This information is not intended to replace advice given to you by your health care provider. Make sure you discuss any questions you have with your health care provider. Document Released: 09/11/2013 Document Revised: 02/13/2014 Document Reviewed: 09/11/2013 Elsevier Interactive Patient Education  2017 [employer].

## 2016-06-16 ENCOUNTER — Other Ambulatory Visit (INDEPENDENT_AMBULATORY_CARE_PROVIDER_SITE_OTHER): Payer: Self-pay | Admitting: General Surgery

## 2016-06-16 MED ORDER — DOXYCYCLINE HYCLATE 50 MG PO CAPS: 100.0000 mg | ORAL_CAPSULE | Freq: Two times a day (BID) | ORAL | 0 refills | Status: AC

## 2016-06-16 MED FILL — DOXYCYCLINE HYC 50 MG CAP: 50 | 7 days supply | Qty: 28 | Fill #0

## 2016-06-17 LAB — AEROBIC/ANAEROBIC CULTURE W GRAM STAIN (SURGICAL/DEEP WOUND)

## 2016-06-19 ENCOUNTER — Emergency Department (HOSPITAL_COMMUNITY)
Admission: EM | Admit: 2016-06-19 | Discharge: 2016-06-19 | Disposition: A | Discharge status: Home / Self Care | Payer: Medicare HMO | Attending: Emergency Medicine | Admitting: Emergency Medicine

## 2016-06-19 ENCOUNTER — Encounter (HOSPITAL_COMMUNITY): Payer: Self-pay | Admitting: *Deleted

## 2016-06-19 DIAGNOSIS — Z96642 Presence of left artificial hip joint: Secondary | ICD-10-CM | POA: Insufficient documentation

## 2016-06-19 DIAGNOSIS — I10 Essential (primary) hypertension: Secondary | ICD-10-CM | POA: Diagnosis not present

## 2016-06-19 DIAGNOSIS — F17200 Nicotine dependence, unspecified, uncomplicated: Secondary | ICD-10-CM | POA: Diagnosis not present

## 2016-06-19 DIAGNOSIS — Z79899 Other long term (current) drug therapy: Secondary | ICD-10-CM | POA: Diagnosis not present

## 2016-06-19 DIAGNOSIS — Y829 Unspecified medical devices associated with adverse incidents: Secondary | ICD-10-CM | POA: Insufficient documentation

## 2016-06-19 DIAGNOSIS — T148XXA Other injury of unspecified body region, initial encounter: Secondary | ICD-10-CM

## 2016-06-19 DIAGNOSIS — T814XXA Infection following a procedure, initial encounter: Secondary | ICD-10-CM | POA: Diagnosis not present

## 2016-06-19 DIAGNOSIS — Z9889 Other specified postprocedural states: Secondary | ICD-10-CM

## 2016-06-19 LAB — COMPREHENSIVE METABOLIC PANEL
ALBUMIN: 3.3 g/dL — AB (ref 3.5–5.0)
ALK PHOS: 77 U/L (ref 38–126)
ALT: 16 U/L — AB (ref 17–63)
AST: 18 U/L (ref 15–41)
Anion gap: 11 (ref 5–15)
BUN: 11 mg/dL (ref 6–20)
CALCIUM: 9.4 mg/dL (ref 8.9–10.3)
CO2: 29 mmol/L (ref 22–32)
CREATININE: 1.02 mg/dL (ref 0.61–1.24)
Chloride: 94 mmol/L — ABNORMAL LOW (ref 101–111)
GFR calc Af Amer: >60 mL/min (ref >60)
GFR calc non Af Amer: >60 mL/min (ref >60)
GLUCOSE: 109 mg/dL — AB (ref 65–99)
Potassium: 4.3 mmol/L (ref 3.5–5.1)
Sodium: 134 mmol/L — ABNORMAL LOW (ref 135–145)
Total Bilirubin: 0.4 mg/dL (ref 0.3–1.2)
Total Protein: 7.5 g/dL (ref 6.5–8.1)

## 2016-06-19 LAB — CBC
HCT: 39.2 % (ref 39.0–52.0)
Hemoglobin: 13.4 g/dL (ref 13.0–17.0)
MCH: 34.3 pg — AB (ref 26.0–34.0)
MCHC: 34.2 g/dL (ref 30.0–36.0)
MCV: 100.3 fL — ABNORMAL HIGH (ref 78.0–100.0)
PLATELETS: 408 10*3/uL — AB (ref 150–400)
RBC: 3.91 MIL/uL — ABNORMAL LOW (ref 4.22–5.81)
RDW: 12.7 % (ref 11.5–15.5)
WBC: 9.6 10*3/uL (ref 4.0–10.5)

## 2016-06-19 NOTE — ED Provider Notes (Signed)
[facility] DEPT Provider Note   CSN: [id number] Arrival date & time: 06/19/16  [id number]     History   Chief Complaint Chief Complaint  Patient presents with  . Post-op Problem    HPI Jimmy Castaneda is a 71 y.o. male who had an I&D of a left gluteal abscess by Dr. Kae Heller on 06/13/2016. He presents to the ED with cc of Heavy bleeding form the operation site. He has a penrose drain in place. He states that today his wound started gushing blood. He states that he could not stop the bleeding. He denies sob or light headedness. He has a hx of hep C his hgb at discharge was 11.6 HPI  Past Medical History:  Diagnosis Date  . Adenomatous colon polyp   . GERD (gastroesophageal reflux disease)    never required medication to treat  . Hepatitis C   . Hypertension   . Osteoarthritis    plans left hip replacement   . Rheumatoid arthritis ([place])   . Vitiligo     Patient Active Problem List   Diagnosis Date Noted  . Gluteal abscess 06/13/2016  . Liver fibrosis ([place]) 11/05/2015  . Chronic hepatitis C without hepatic coma ([place]) 07/24/2015  . Expected blood loss anemia 10/04/2013  . S/P hip replacement 10/03/2013  . 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 COLONOSCOPY  2014  . excision of nodule rt wrist -local anesthesia  yrs ago  . INCISION AND DRAINAGE PERIRECTAL ABSCESS Left 06/13/2016   Procedure: IRRIGATION AND DEBRIDEMENT OF GLUTAL ABSCESS;  Surgeon: Clovis Riley, MD;  Location: [facility];  Service: General;  Laterality: Left;  Marland Kitchen MULTIPLE EXTRACTIONS WITH ALVEOLOPLASTY N/A 06/22/2013   Procedure: Extraction of tooth #'s 3,4,5,8,9,10,12,21,22,23,27 with alveoloplasty, mandibular left torus reduction;  Surgeon: Lenn Cal, DDS;  Location: [facility];  Service: Oral Surgery;  Laterality: N/A;  . POLYPECTOMY    . TOTAL HIP ARTHROPLASTY Left 10/03/2013   Procedure: LEFT TOTAL HIP ARTHROPLASTY ANTERIOR APPROACH;  Surgeon: Mauri Pole,  MD;  Location: [facility];  Service: Orthopedics;  Laterality: Left;       Home Medications    Prior to Admission medications   Medication Sig Start Date End Date Taking? Authorizing Provider  amLODipine (NORVASC) 5 MG tablet Take 5 mg by mouth every morning.    Historical Provider, MD  amoxicillin-clavulanate (AUGMENTIN) 875-125 MG tablet Take 1 tablet by mouth every 12 (twelve) hours. 06/15/16   Kalman Drape, PA  doxycycline (VIBRAMYCIN) 50 MG capsule Take 2 capsules (100 mg total) by mouth 2 (two) times daily. 06/16/16 06/23/16  Kalman Drape, PA  hydrochlorothiazide (HYDRODIURIL) 25 MG tablet Take 25 mg by mouth every morning. Reported on 11/05/2015    Historical Provider, MD  Ledipasvir-Sofosbuvir (HARVONI) 90-400 MG TABS Take 1 tablet by mouth daily. Patient not taking: Reported on 06/13/2016 07/24/15   Thayer Headings, MD  metoprolol (LOPRESSOR) 100 MG tablet Take 50 mg by mouth 2 (two) times daily. 1/2 tablet twice a day    Historical Provider, MD  traMADol (ULTRAM) 50 MG tablet Take 1 tablet (50 mg total) by mouth every 6 (six) hours as needed for severe pain. 06/15/16   Kalman Drape, PA    Family History Family History  Problem Relation Age of Onset  . Diabetes Mother   . Hypertension Mother   . Diabetes Father   . Hypertension Father   . Diabetes Sister  Social History Social History  Substance Use Topics  . Smoking status: Current Every Day Smoker    Packs/day: 0.50    Years: 35.00  . Smokeless tobacco: Never Used  . Alcohol use 2.4 oz/week    4 Cans of beer per week     Comment: rarely     Allergies   Aspirin   Review of Systems Review of Systems Ten systems reviewed and are negative for acute change, except as noted in the HPI.    Physical Exam Updated Vital Signs BP 143/65   Pulse (!) 58   Temp 97.9 F (36.6 C) (Oral)   Resp 17   Ht 5\' 8"  (1.727 m)   Wt 78 kg   SpO2 99%   BMI 26.15 kg/m   Physical Exam  Constitutional: He appears  well-developed and well-nourished. No distress.  HENT:  Head: Normocephalic and atraumatic.  Eyes: Conjunctivae are normal. No scleral icterus.  Neck: Normal range of motion. Neck supple.  Cardiovascular: Normal rate, regular rhythm and normal heart sounds.   Pulmonary/Chest: Effort normal and breath sounds normal. No respiratory distress.  Abdominal: Soft. There is no tenderness.  Musculoskeletal: He exhibits no edema.  S/P I&D of the left Gluteus. Appears well healing. Massive clot attached to the penrose drain removed. There is some oozing blood from the wound.  Neurological: He is alert.  Skin: Skin is warm and dry. He is not diaphoretic.  Psychiatric: His behavior is normal.  Nursing note and vitals reviewed.    ED Treatments / Results  Labs (all labs ordered are listed, but only abnormal results are displayed) Labs Reviewed  COMPREHENSIVE METABOLIC PANEL - Abnormal; Notable for the following:       Result Value   Sodium 134 (*)    Chloride 94 (*)    Glucose, Bld 109 (*)    Albumin 3.3 (*)    ALT 16 (*)    All other components within normal limits  CBC - Abnormal; Notable for the following:    RBC 3.91 (*)    MCV 100.3 (*)    MCH 34.3 (*)    Platelets 408 (*)    All other components within normal limits    EKG  EKG Interpretation None       Radiology No results found.  Procedures Procedures (including critical care time)  Medications Ordered in ED Medications - No data to display   Initial Impression / Assessment and Plan / ED Course  I have reviewed the triage vital signs and the nursing notes.  Pertinent labs & imaging results that were available during my care of the patient were reviewed by me and considered in my medical decision making (see chart for details).    Clinical Course as of Jun 20 48  Fri Jun 19, 2016  2208 HGB improved since d.c. Hemoglobin: 13.4 [AH]    Clinical Course User Index [AH] Margarita Mail, PA-C  Patient's bleeding  has resolved. Hemoglobin appears to be at baseline and improving from his recent surgery. I discussed reasons to seek immediate medical care. Patient appears safe for discharge at this time. He is a follow-up on Tuesday with Dr. Windle Guard   Final Clinical Impressions(s) / ED Diagnoses   Final diagnoses:  Bleeding from wound  Status post incision and drainage   New Prescriptions Discharge Medication List as of 06/19/2016 10:08 PM       Margarita Mail, PA-C 06/20/16 [place], DO 06/22/16 [id number]

## 2016-06-19 NOTE — ED Notes (Signed)
Pt verbalized understanding discharge instructions and denies any further needs or questions at this time. VS stable, ambulatory and steady gait.   

## 2016-06-19 NOTE — ED Triage Notes (Signed)
The pt  Had a ?? Rectal abscess drained in the or on Monday  This evening he has had  Some dark red drainage with his sitz baths

## 2016-06-19 NOTE — Discharge Instructions (Signed)
Your bleeding appears to be controlled.  Please return if your bleeding worsens or you have signs of infection. Please return if you feel dizzy or short of breath.  Contact a health care provider if:  Your incision is bleeding.  You have signs of infection at your incision or around the incision. Watch for: ? Drainage. ? Redness. ? Swelling. ? Pain.  There is a bad smell coming from your incision site.  Your pain medicine is not helping.  You have a fever or chills.  You have muscles aches.  You are dizzy.  You feel generally ill.

## 2017-03-05 IMAGING — CT CT PELVIS W/ CM
2 of 4 series · 13 of 46 positions shown, 15 images · IV contrast (Iodine)
Comparison: None.

CLINICAL DATA: Possible abscess on left buttocks.

EXAM:
CT PELVIS WITH CONTRAST
TECHNIQUE: Multidetector CT imaging of the pelvis was performed using the
standard protocol following the bolus administration of intravenous
contrast.
CONTRAST:  75mL N9TBNI-7AA IOPAMIDOL (N9TBNI-7AA) INJECTION 61%

[Series 201: routine, idose (2) · axial · 0.72mm/px · z∈[+443,+718]mm · 10 of 65 slices shown, 12 images]
[im 5/65  soft-tissue]
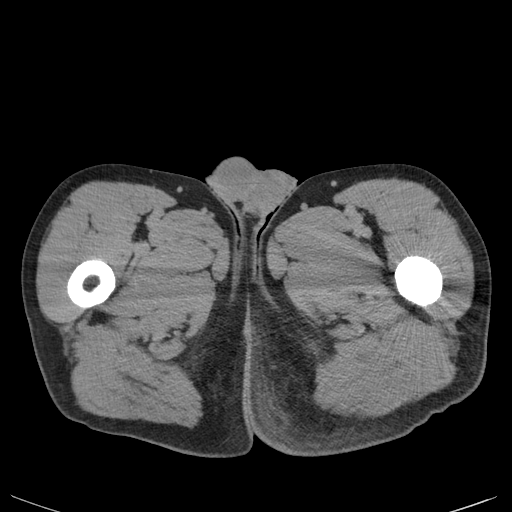
[im 5/65  bone]
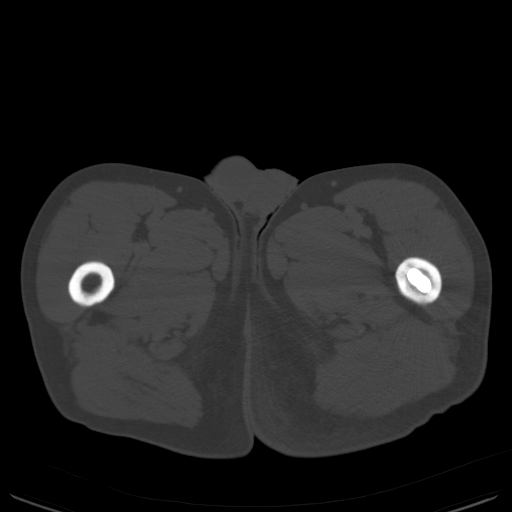
[im 13/65  soft-tissue]
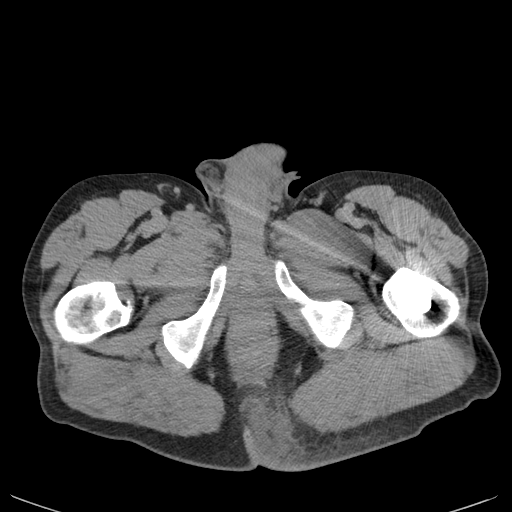
[im 18/65  soft-tissue]
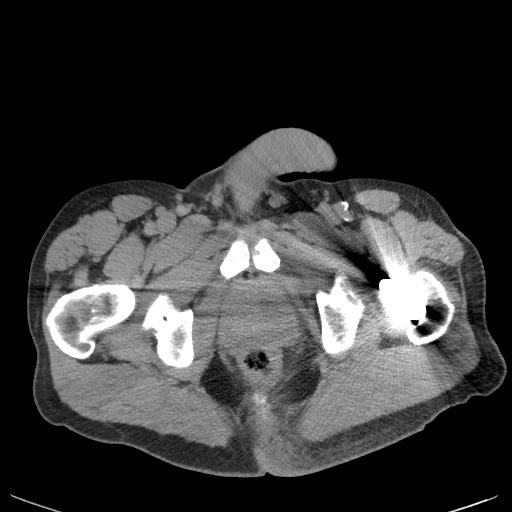
[im 22/65  soft-tissue]
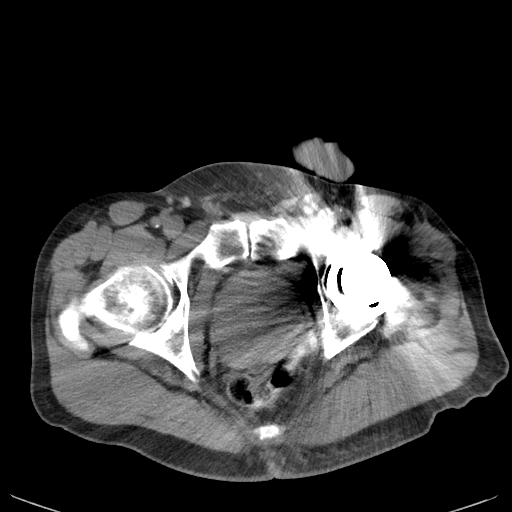
[im 30/65  soft-tissue]
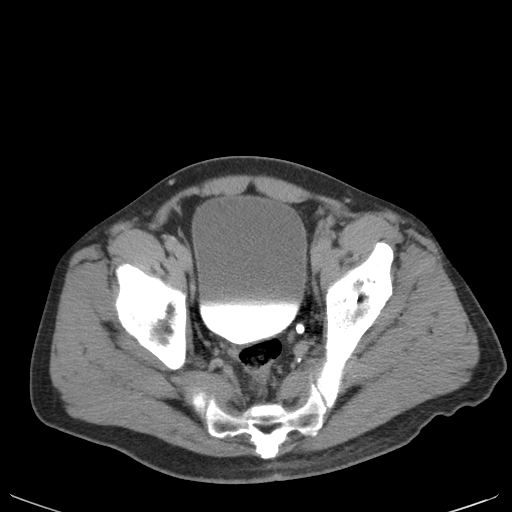
[im 35/65  soft-tissue]
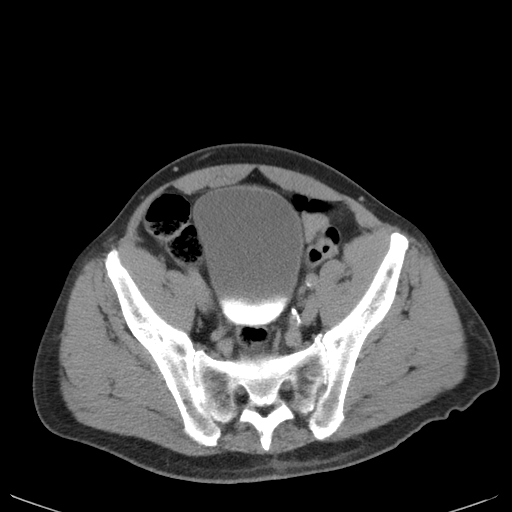
[im 43/65  soft-tissue]
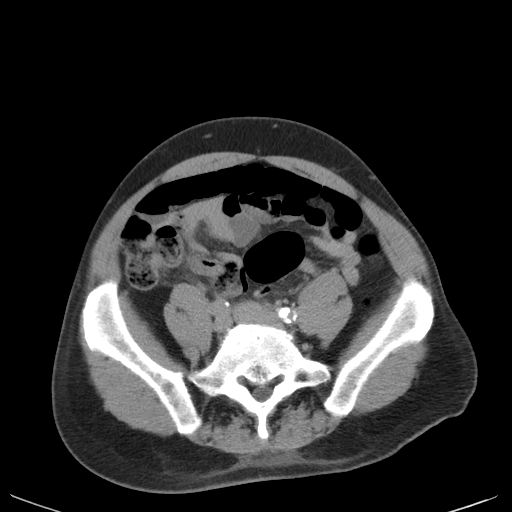
[im 47/65  soft-tissue]
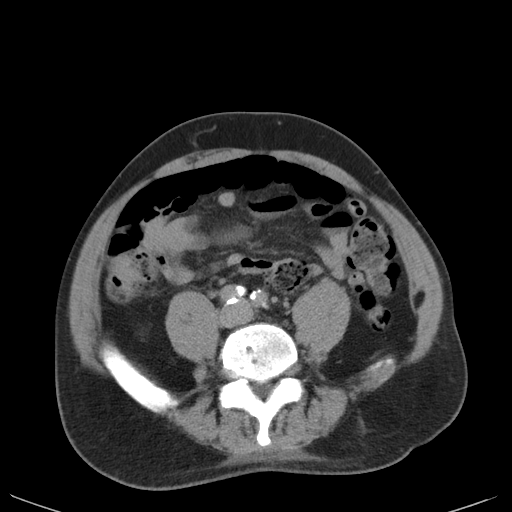
[im 52/65  soft-tissue]
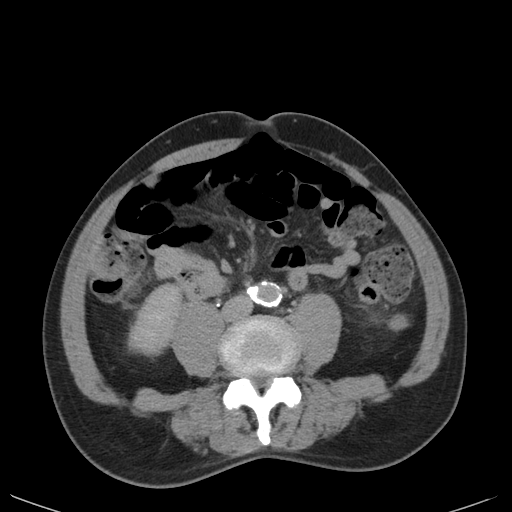
[im 52/65  bone]
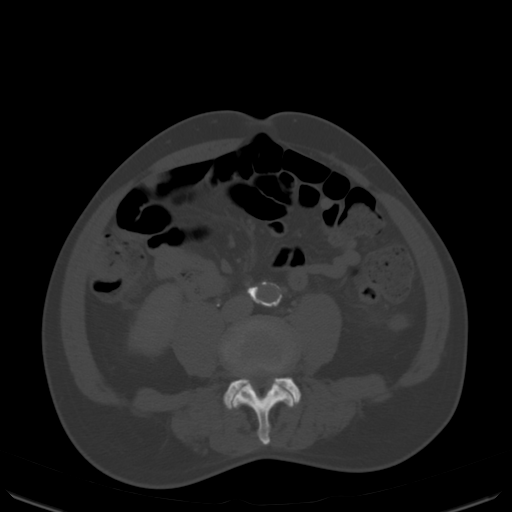
[im 60/65  soft-tissue]
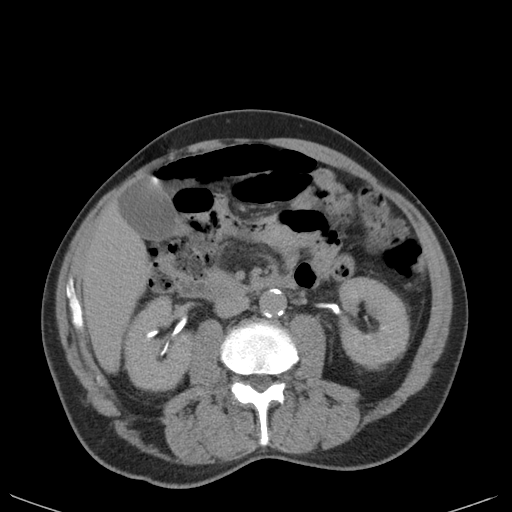

[Series 203: coronals, idose (2) · coronal · 0.45mm/px · 3 of 89 slices shown]
[im 30/89  soft-tissue]
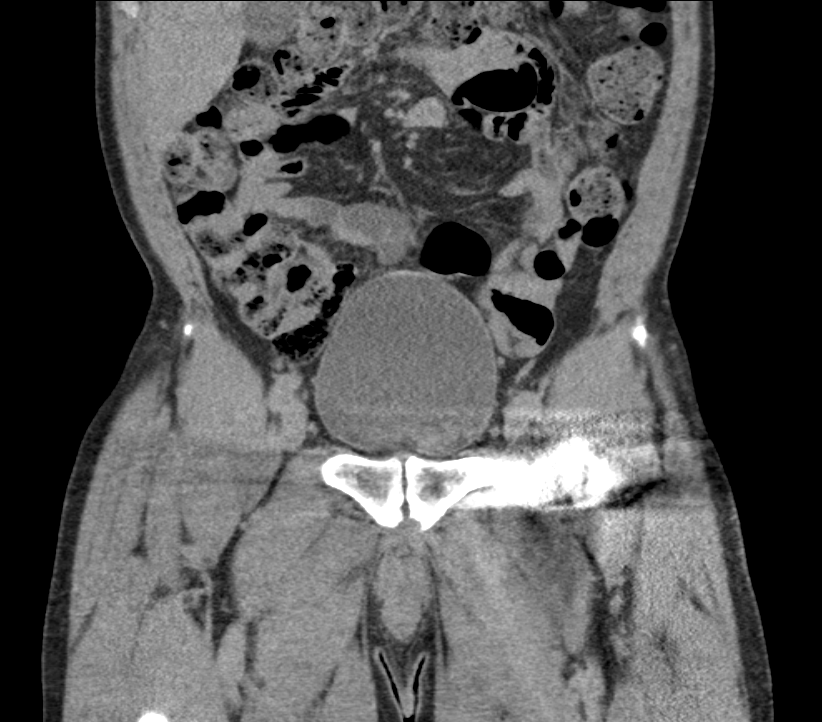
[im 40/89  soft-tissue]
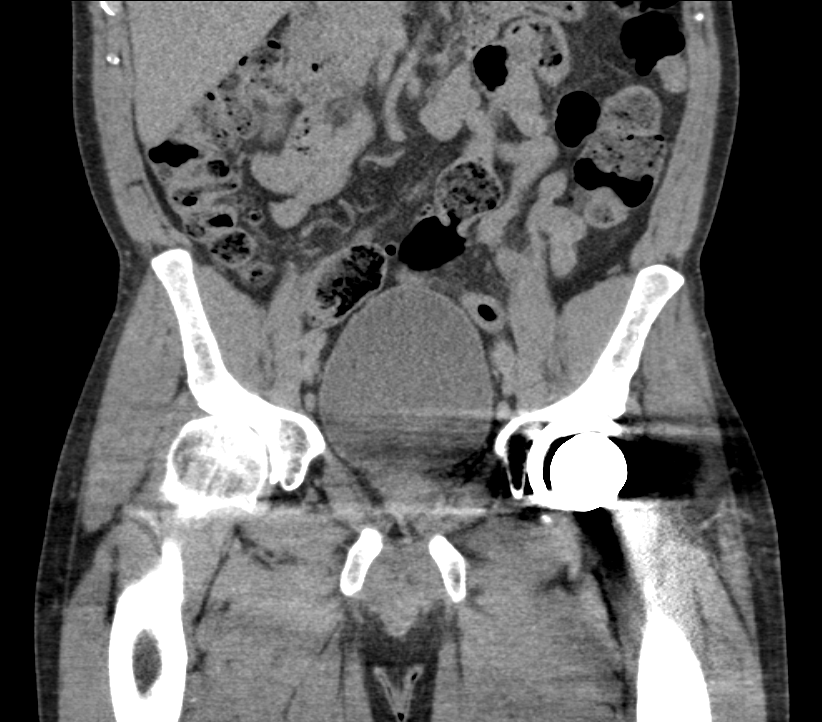
[im 49/89  soft-tissue]
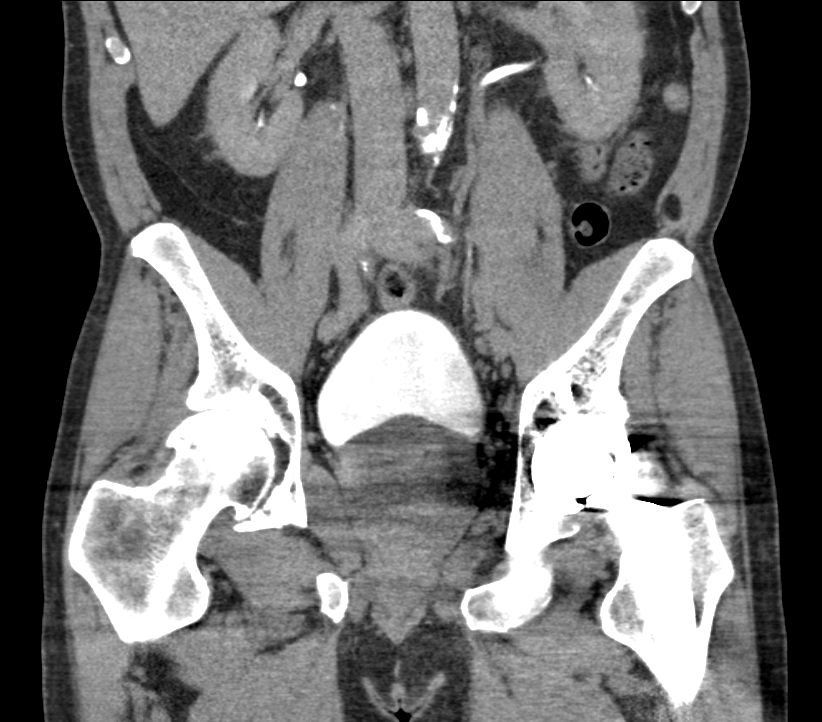

[13 of 46 positions shown; findings below may reference images not displayed]

FINDINGS: Urinary Tract: There is a probable cyst in the left kidney,
incompletely characterized on today's study. No other renal or
ureteral abnormalities. The bladder is unremarkable as well.

Bowel:  Unremarkable visualized pelvic bowel loops.

Vascular/Lymphatic: Atherosclerosis is seen in the visualized
abdominal aorta, iliac vessels, and femoral vessels. No adenopathy.

Reproductive: The prostate is enlarged measuring 6.7 by 6.8 cm in AP
and transverse dimension.

Other: Significant fat stranding in the left buttocks most prominent
just to the left of the gluteal crease. There is increased density
as seen on series 201, image 54 and coronal image 89 measuring
by 3.2 by 4.3 cm. There is interspersed fat attenuation. The overall
attenuation is 20 Hounsfield units. Overlying skin thickening is
identified. No soft tissue gas is noted. No involvement of the
underlying muscle is noted.

Musculoskeletal: The patient is status post left hip replacement. No
acute bony abnormalities. Right hip degenerative changes are noted.
IMPRESSION: 1. Fat stranding and skin thickening in the left buttocks with an
ill defined region of rounded increased attenuation just to the left
of the gluteal fold. The ill defined rounded increased attenuation
is consistent with a phlegmon/developing abscess. The interspersed
fat attenuation and lack of peripheral enhancement suggests there
may not be drainable fluid at this time. An ultrasound could better
assess for drainable fluid if clinically warranted.
2. Atherosclerosis.
3. Enlarged prostate.

## 2021-11-03 ENCOUNTER — Encounter: Payer: Self-pay | Admitting: Gastroenterology
